# Patient Record
Sex: Male | Born: 1970
Health system: Southern US, Community
[De-identification: ages and names within clinical notes are randomized; demographics above are authoritative.]

## PROBLEM LIST (undated history)

## (undated) DIAGNOSIS — E785 Hyperlipidemia, unspecified: Secondary | ICD-10-CM

## (undated) DIAGNOSIS — K219 Gastro-esophageal reflux disease without esophagitis: Secondary | ICD-10-CM

## (undated) DIAGNOSIS — J45909 Unspecified asthma, uncomplicated: Secondary | ICD-10-CM

## (undated) DIAGNOSIS — I10 Essential (primary) hypertension: Secondary | ICD-10-CM

## (undated) DIAGNOSIS — T7840XA Allergy, unspecified, initial encounter: Secondary | ICD-10-CM

## (undated) HISTORY — PX: OTHER SURGICAL HISTORY: SHX169

## (undated) HISTORY — DX: Allergy, unspecified, initial encounter: T78.40XA

## (undated) HISTORY — DX: Hyperlipidemia, unspecified: E78.5

## (undated) HISTORY — DX: Unspecified asthma, uncomplicated: J45.909

## (undated) HISTORY — PX: HERNIA REPAIR: SHX51

## (undated) HISTORY — DX: Essential (primary) hypertension: I10

## (undated) HISTORY — DX: Gastro-esophageal reflux disease without esophagitis: K21.9

---

## 2003-07-07 ENCOUNTER — Emergency Department (HOSPITAL_COMMUNITY): Admission: AD | Admit: 2003-07-07 | Discharge: 2003-07-07 | Payer: Self-pay | Admitting: Family Medicine

## 2003-09-16 ENCOUNTER — Emergency Department (HOSPITAL_COMMUNITY): Admission: EM | Admit: 2003-09-16 | Discharge: 2003-09-16 | Payer: Self-pay | Admitting: Emergency Medicine

## 2005-06-13 ENCOUNTER — Emergency Department (HOSPITAL_COMMUNITY): Admission: EM | Admit: 2005-06-13 | Discharge: 2005-06-13 | Payer: Self-pay | Admitting: Family Medicine

## 2005-08-19 ENCOUNTER — Emergency Department (HOSPITAL_COMMUNITY): Admission: EM | Admit: 2005-08-19 | Discharge: 2005-08-19 | Payer: Self-pay | Admitting: Family Medicine

## 2006-02-14 ENCOUNTER — Emergency Department (HOSPITAL_COMMUNITY): Admission: EM | Admit: 2006-02-14 | Discharge: 2006-02-14 | Payer: Self-pay | Admitting: Emergency Medicine

## 2006-02-26 ENCOUNTER — Emergency Department (HOSPITAL_COMMUNITY): Admission: AD | Admit: 2006-02-26 | Discharge: 2006-02-26 | Payer: Self-pay | Admitting: Family Medicine

## 2006-12-06 ENCOUNTER — Emergency Department (HOSPITAL_COMMUNITY): Admission: EM | Admit: 2006-12-06 | Discharge: 2006-12-06 | Payer: Self-pay | Admitting: Emergency Medicine

## 2007-09-09 ENCOUNTER — Emergency Department (HOSPITAL_COMMUNITY): Admission: EM | Admit: 2007-09-09 | Discharge: 2007-09-09 | Payer: Self-pay | Admitting: Emergency Medicine

## 2008-07-15 ENCOUNTER — Emergency Department (HOSPITAL_COMMUNITY): Admission: EM | Admit: 2008-07-15 | Discharge: 2008-07-15 | Payer: Self-pay | Admitting: Emergency Medicine

## 2008-07-17 ENCOUNTER — Emergency Department (HOSPITAL_COMMUNITY): Admission: EM | Admit: 2008-07-17 | Discharge: 2008-07-17 | Payer: Self-pay | Admitting: Emergency Medicine

## 2008-09-26 ENCOUNTER — Ambulatory Visit: Payer: Self-pay | Admitting: Cardiology

## 2008-09-26 ENCOUNTER — Inpatient Hospital Stay (HOSPITAL_COMMUNITY): Admission: EM | Admit: 2008-09-26 | Discharge: 2008-09-27 | Payer: Self-pay | Admitting: Emergency Medicine

## 2008-09-27 ENCOUNTER — Encounter (INDEPENDENT_AMBULATORY_CARE_PROVIDER_SITE_OTHER): Payer: Self-pay | Admitting: *Deleted

## 2008-10-28 ENCOUNTER — Telehealth (INDEPENDENT_AMBULATORY_CARE_PROVIDER_SITE_OTHER): Payer: Self-pay | Admitting: *Deleted

## 2008-10-28 DIAGNOSIS — I10 Essential (primary) hypertension: Secondary | ICD-10-CM

## 2008-10-29 ENCOUNTER — Encounter: Payer: Self-pay | Admitting: Cardiology

## 2008-10-29 ENCOUNTER — Ambulatory Visit: Payer: Self-pay | Admitting: Cardiology

## 2008-10-29 ENCOUNTER — Encounter: Payer: Self-pay | Admitting: Cardiovascular Disease

## 2008-10-29 ENCOUNTER — Ambulatory Visit: Payer: Self-pay

## 2008-10-29 DIAGNOSIS — R072 Precordial pain: Secondary | ICD-10-CM

## 2008-10-29 LAB — CONVERTED CEMR LAB
AST: 24 units/L (ref 0–37)
Albumin: 4.1 g/dL (ref 3.5–5.2)
Alkaline Phosphatase: 59 units/L (ref 39–117)
Bilirubin, Direct: 0 mg/dL (ref 0.0–0.3)
Direct LDL: 149.5 mg/dL
Glucose, Bld: 104 mg/dL — ABNORMAL HIGH (ref 70–99)
HDL: 43.1 mg/dL (ref >39.00)
Hgb A1c MFr Bld: 5.6 % (ref 4.6–6.5)
Total Bilirubin: 1 mg/dL (ref 0.3–1.2)
Total CHOL/HDL Ratio: 5
VLDL: 23.8 mg/dL (ref 0.0–40.0)

## 2009-11-06 IMAGING — CT CT HEAD W/O CM - CT MAXILLOFACIAL W/O CM
3 series · 16 of 47 positions shown, 19 images · non-contrast
Comparison: NONE

CLINICAL DATA: Sinusitis.  Cough, wheezing. 

CT PARANASAL SINUSES
TECHNIQUE: Axial, sagittal, and coronal images were obtained.

[Series 3: 3x3 · axial · 0.35mm/px · z∈[-441,-345]mm · 10 of 40 slices shown, 13 images]
[im 3/40  brain]
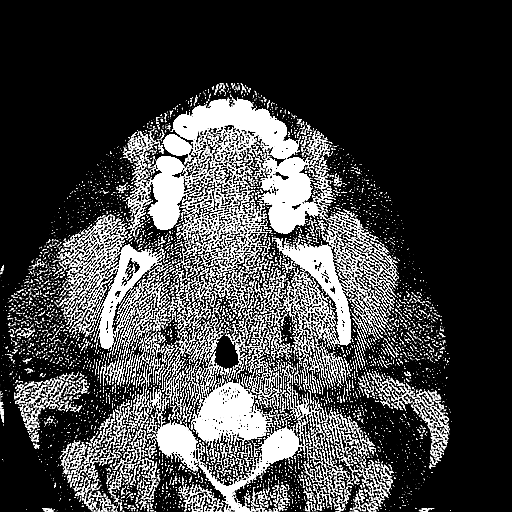
[im 3/40  bone]
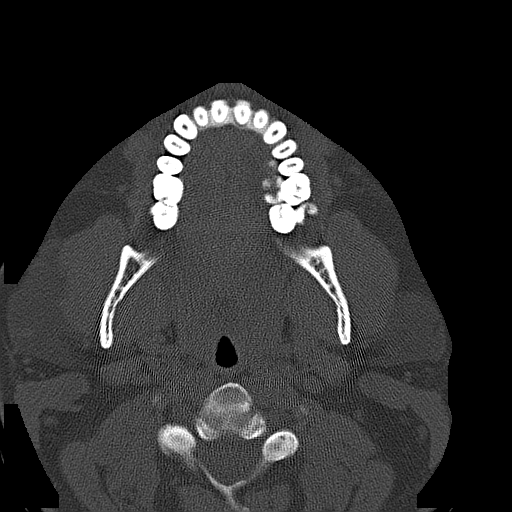
[im 6/40  bone]
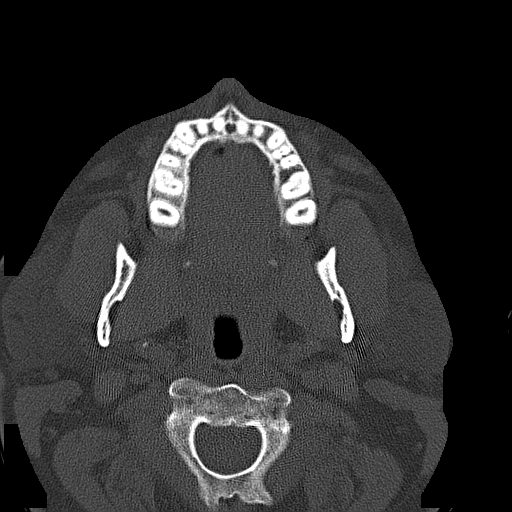
[im 10/40  bone]
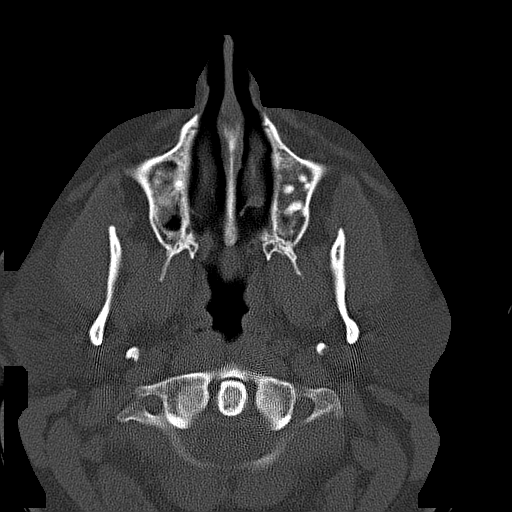
[im 13/40  bone]
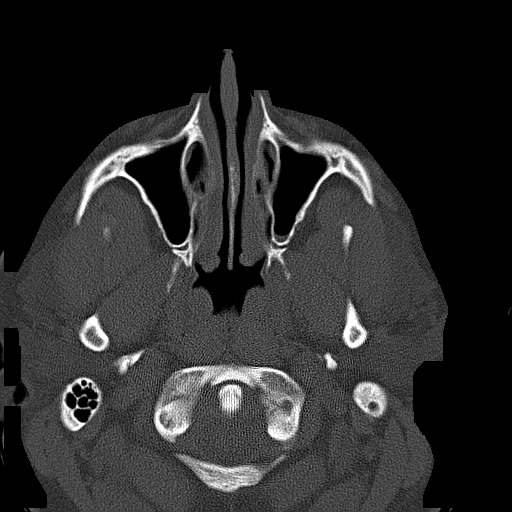
[im 15/40  brain]
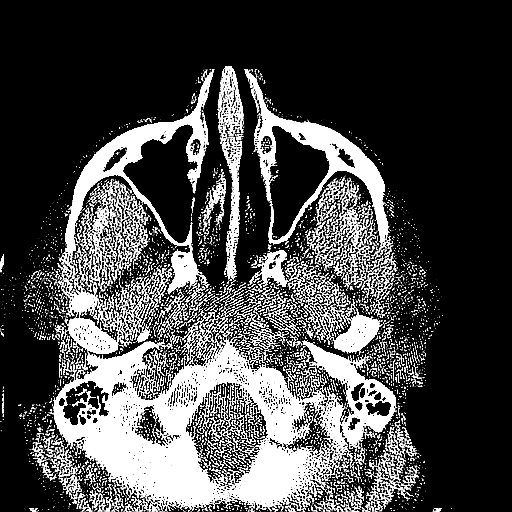
[im 15/40  bone]
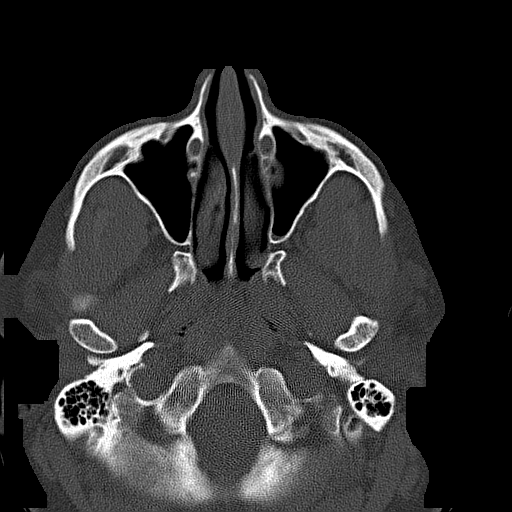
[im 19/40  bone]
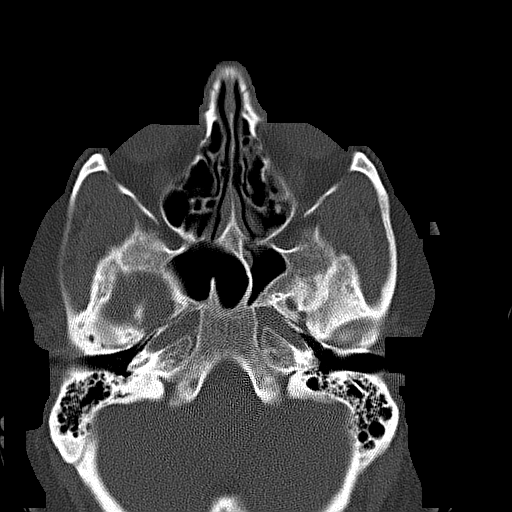
[im 22/40  bone]
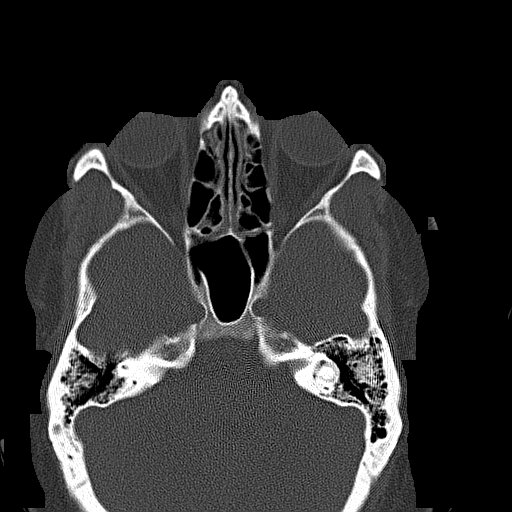
[im 26/40  bone]
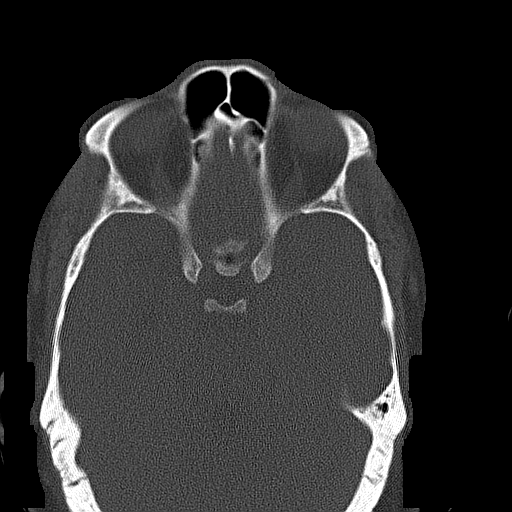
[im 29/40  brain]
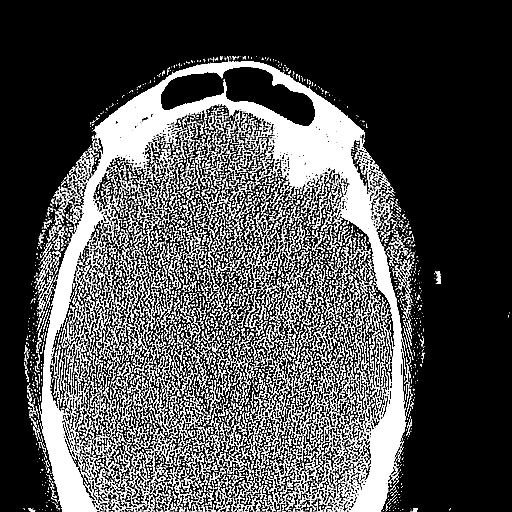
[im 29/40  bone]
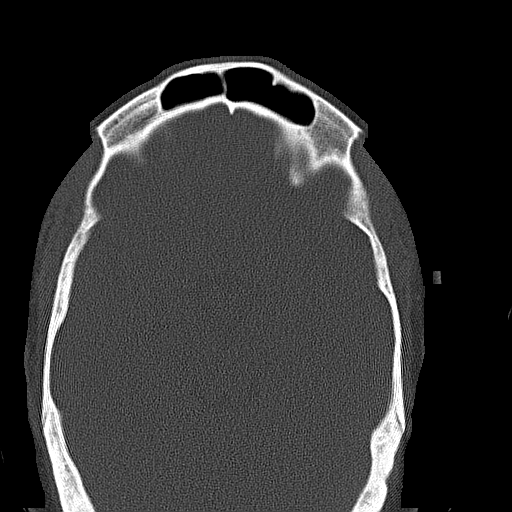
[im 35/40  bone]
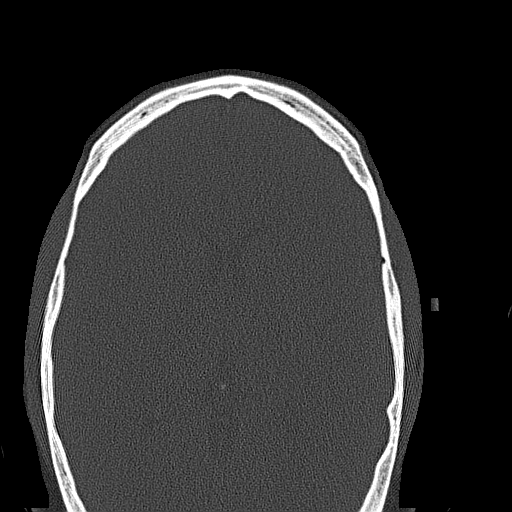

[coronals · coronal · 0.35mm/px · 3 of 28 slices shown]
[im 10/28  bone]
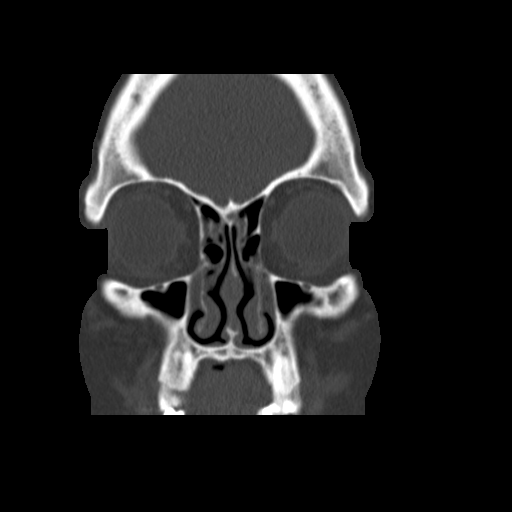
[im 13/28  bone]
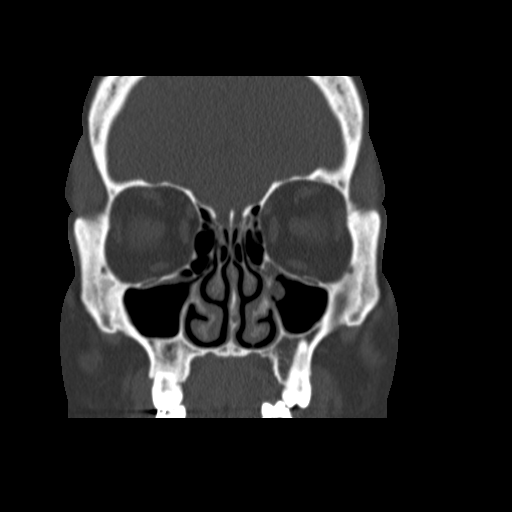
[im 16/28  bone]
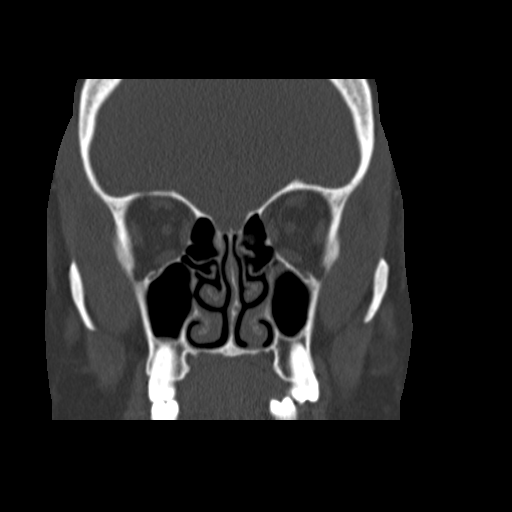

[sagittals · sagittal · 0.35mm/px · 3 of 29 slices shown]
[im 10/29  bone]
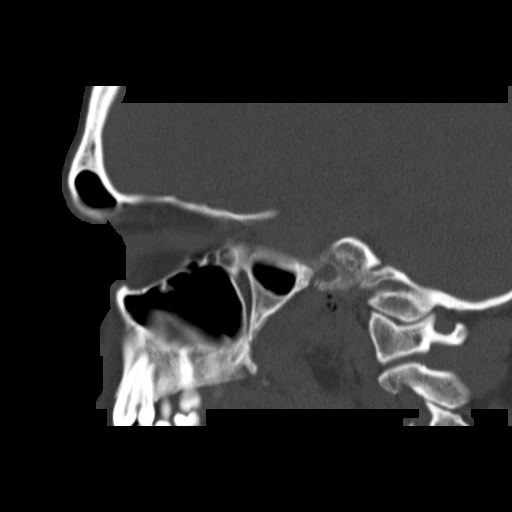
[im 15/29  bone]
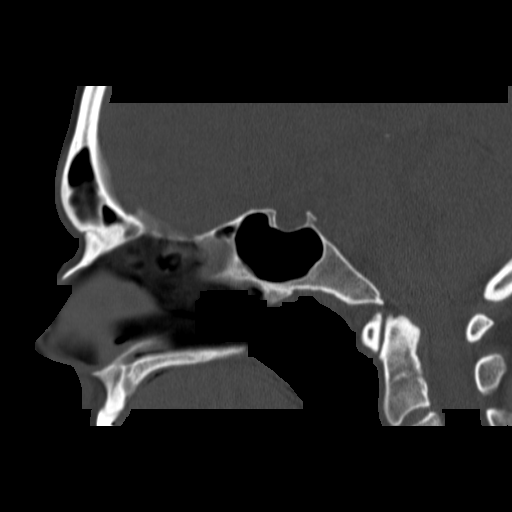
[im 19/29  bone]
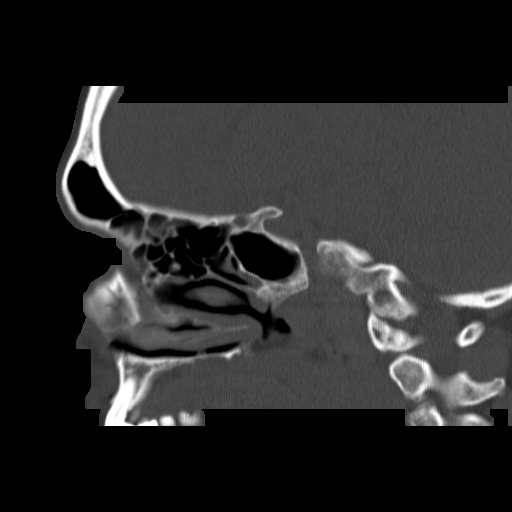

[16 of 47 positions shown; findings below may reference images not displayed]

FINDINGS: There is mucosal occlusion at the infundibulum of the 
OMC on the left.  The infundibulum on the right is patent, but 
narrowed.  No occlusion or polyposis of the nasal passage. Scant 
mucosal thickening is noted in the maxillary sinuses.  Scant 
mucosal thickening in the sphenoid and ethmoid sinuses.  Frontal 
sinus is clear.  Orbits and mastoids are unremarkable.  No 
evidence of nasopharyngeal mass. No fractures, lytic or blastic 
lesions.
IMPRESSION: Minimal-mild areas of chronic mucosal thickening.  No 
evidence of air-fluid level to suggest acute sinusitis. Per Frank Lundblad 
07/17/2008  Tran Date:  07/17/2008 DAS  JLM

## 2009-11-06 IMAGING — CR DG CHEST 2V
1 series · 2 of 2 positions shown · non-contrast
Comparison: NONE

CLINICAL DATA: Severe headaches. Cough. Wheezing. 

CHEST TWO VIEW (PA AND LATERAL)

[Series 1: view not recorded · 0.17mm/px · 2 of 2 slices shown]
[im 1/2]
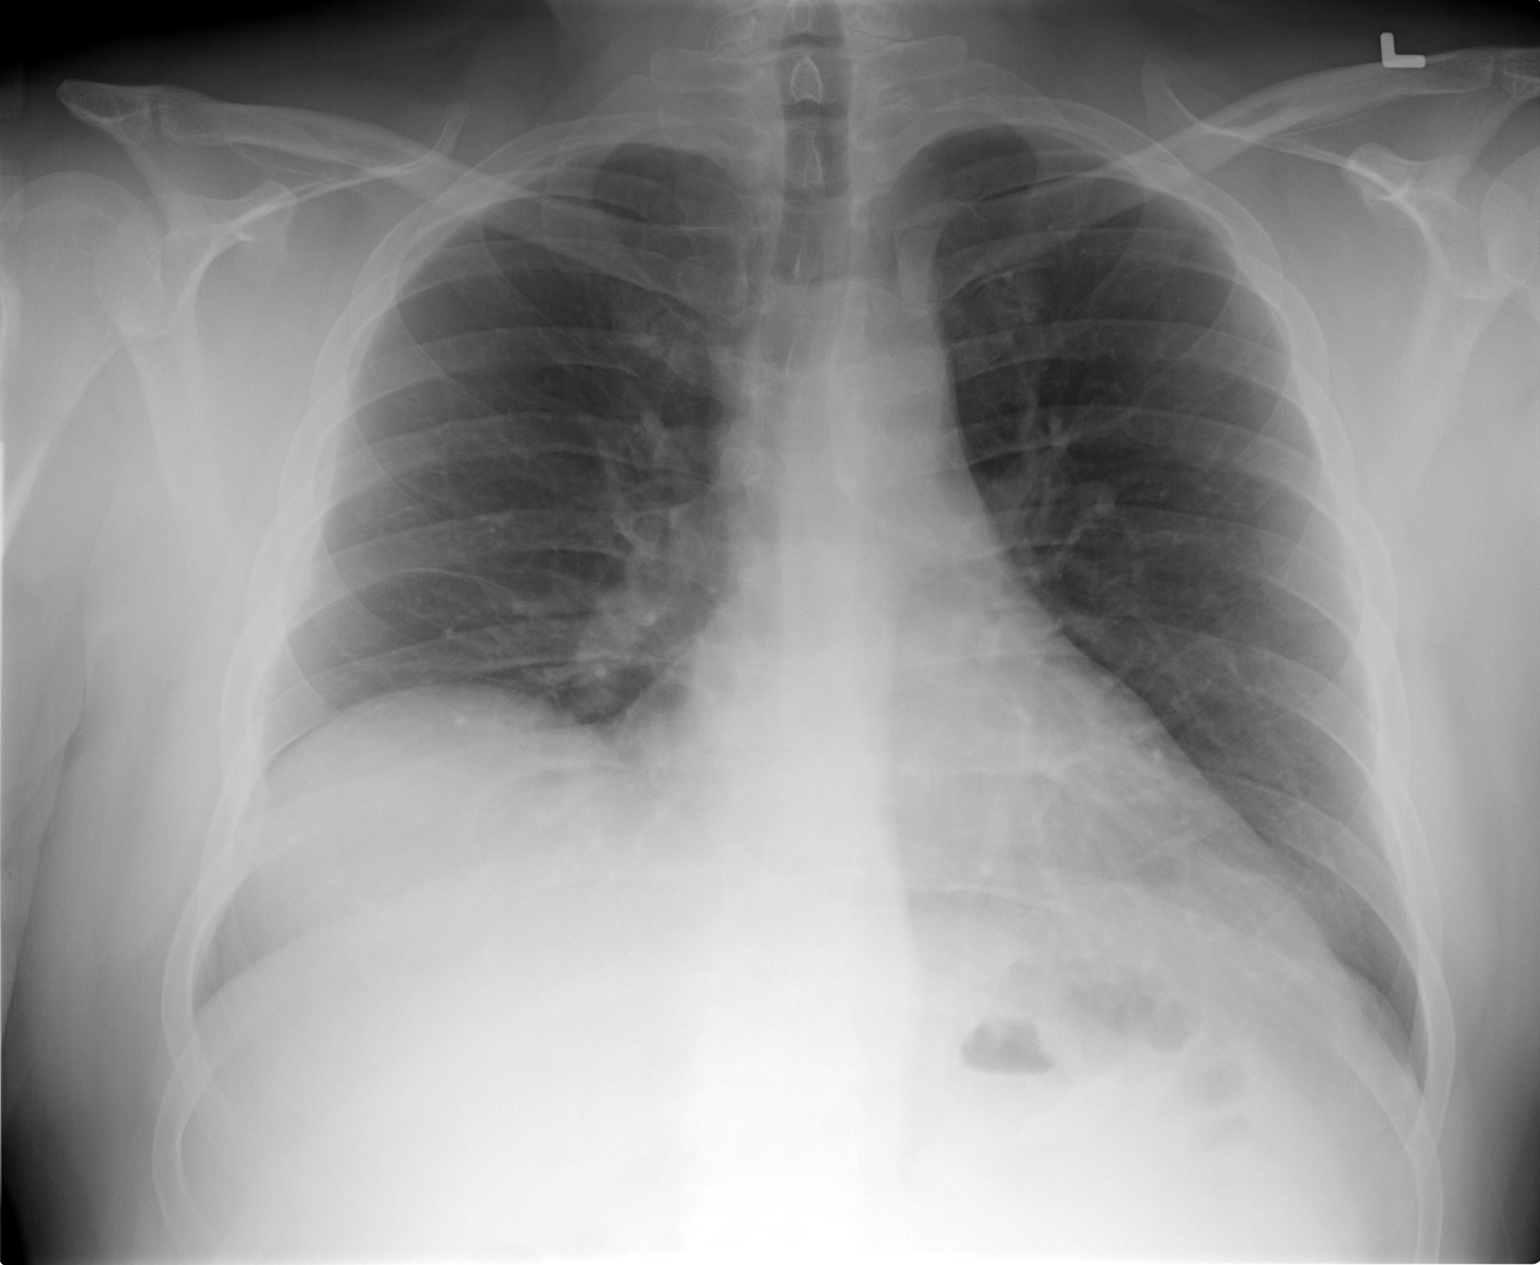
[im 2/2]
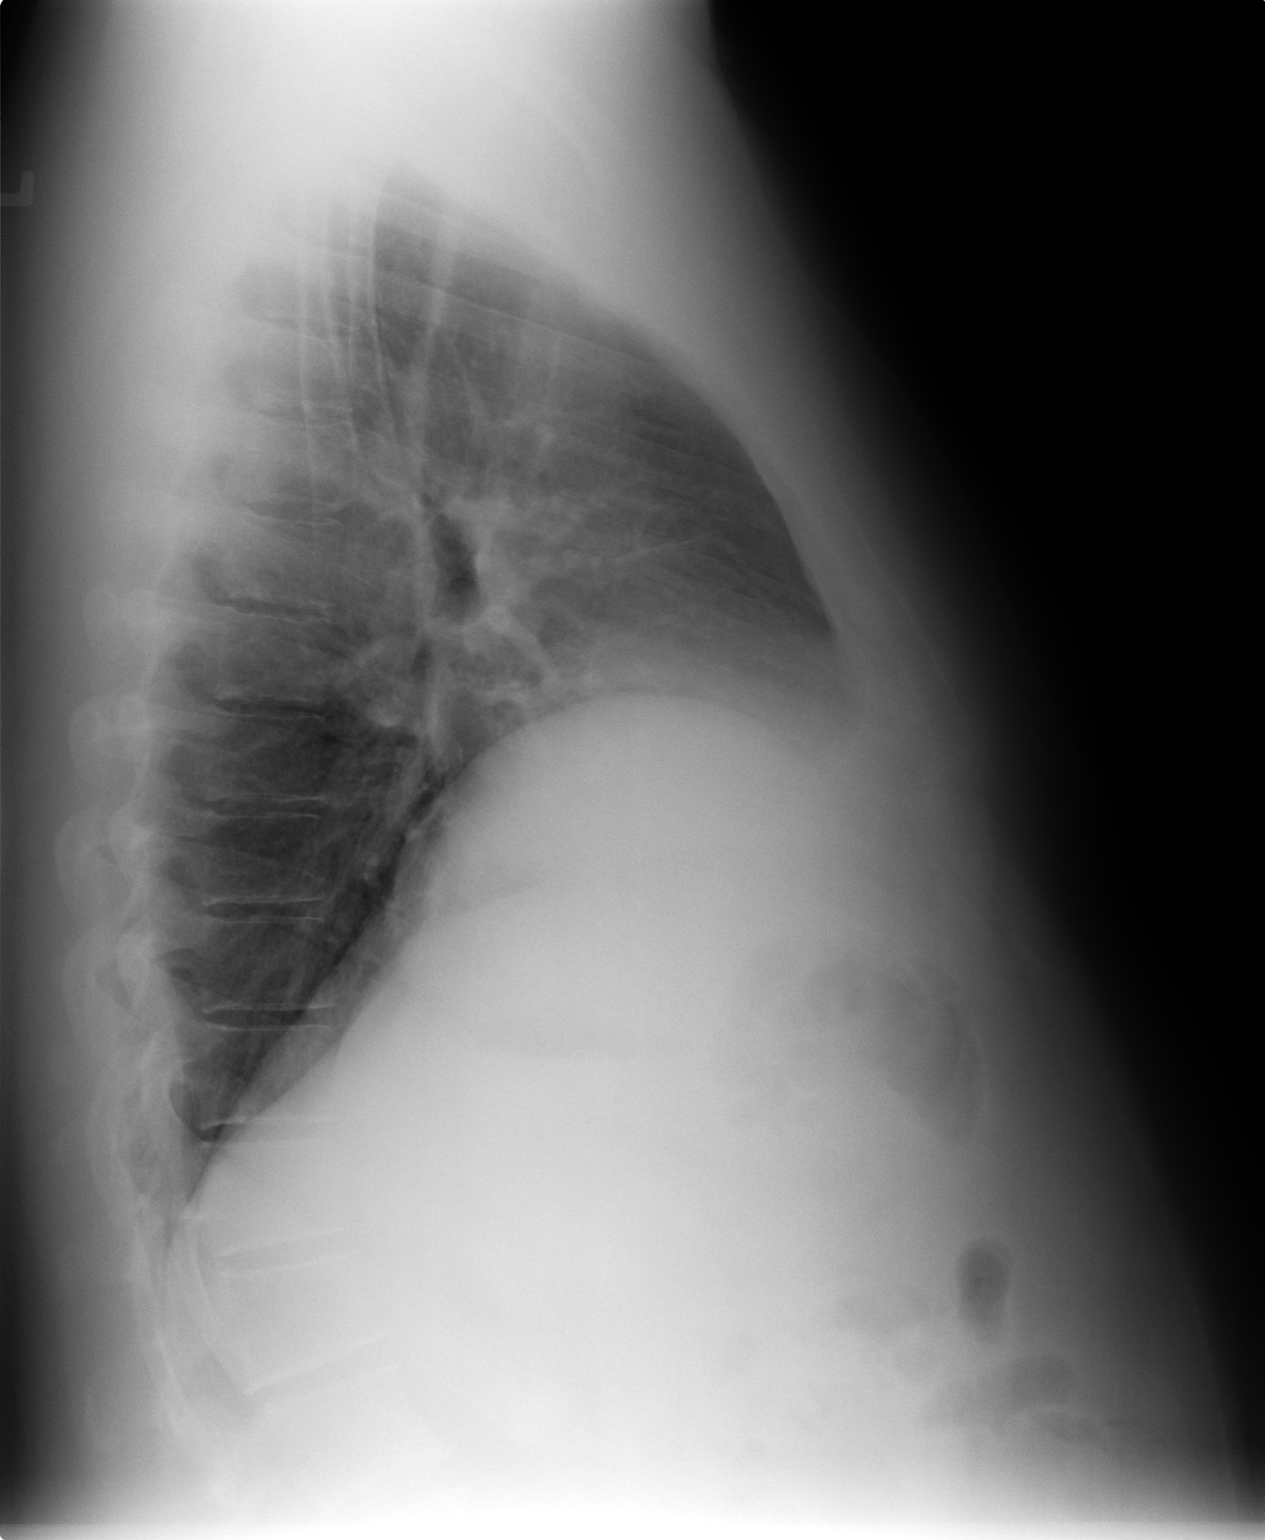

[2 of 2 positions shown; findings below may reference images not displayed]

FINDINGS: Chronic-appearing elevation of the right hemidiaphragm. 
No infiltrate, edema, mass, or effusion. Heart and bony structures 
are unremarkable. 

electronically reviewed on 07/16/2008 Dict Date: 07/15/2008  Tran 
Date:  07/16/2008 DAS  [REDACTED]

## 2010-10-07 ENCOUNTER — Telehealth (INDEPENDENT_AMBULATORY_CARE_PROVIDER_SITE_OTHER): Payer: Self-pay | Admitting: *Deleted

## 2010-10-09 ENCOUNTER — Encounter (HOSPITAL_BASED_OUTPATIENT_CLINIC_OR_DEPARTMENT_OTHER)
Admission: RE | Admit: 2010-10-09 | Discharge: 2010-10-09 | Disposition: A | Discharge status: Home / Self Care | Payer: BC Managed Care – PPO | Source: Ambulatory Visit | Attending: Surgery | Admitting: Surgery

## 2010-10-09 DIAGNOSIS — Z01812 Encounter for preprocedural laboratory examination: Secondary | ICD-10-CM | POA: Insufficient documentation

## 2010-10-09 LAB — COMPREHENSIVE METABOLIC PANEL
ALT: 24 U/L (ref 0–53)
Albumin: 4 g/dL (ref 3.5–5.2)
Alkaline Phosphatase: 61 U/L (ref 39–117)
BUN: 8 mg/dL (ref 6–23)
Calcium: 9.4 mg/dL (ref 8.4–10.5)
Glucose, Bld: 101 mg/dL — ABNORMAL HIGH (ref 70–99)
Potassium: 5 mEq/L (ref 3.5–5.1)
Sodium: 138 mEq/L (ref 135–145)
Total Protein: 7.4 g/dL (ref 6.0–8.3)

## 2010-10-09 LAB — DIFFERENTIAL
Basophils Absolute: 0.1 10*3/uL (ref 0.0–0.1)
Basophils Relative: 1 % (ref 0–1)
Eosinophils Absolute: 0.3 10*3/uL (ref 0.0–0.7)
Eosinophils Relative: 3 % (ref 0–5)
Monocytes Absolute: 1 10*3/uL (ref 0.1–1.0)

## 2010-10-09 LAB — CBC
MCHC: 34.8 g/dL (ref 30.0–36.0)
Platelets: 352 10*3/uL (ref 150–400)
RDW: 12.6 % (ref 11.5–15.5)
WBC: 9 10*3/uL (ref 4.0–10.5)

## 2010-10-13 ENCOUNTER — Ambulatory Visit (HOSPITAL_BASED_OUTPATIENT_CLINIC_OR_DEPARTMENT_OTHER)
Admission: RE | Admit: 2010-10-13 | Discharge: 2010-10-13 | Disposition: A | Discharge status: Home / Self Care | Payer: BC Managed Care – PPO | Source: Ambulatory Visit | Attending: Surgery | Admitting: Surgery

## 2010-10-13 DIAGNOSIS — E669 Obesity, unspecified: Secondary | ICD-10-CM | POA: Insufficient documentation

## 2010-10-13 DIAGNOSIS — K420 Umbilical hernia with obstruction, without gangrene: Secondary | ICD-10-CM | POA: Insufficient documentation

## 2010-10-13 DIAGNOSIS — Z01818 Encounter for other preprocedural examination: Secondary | ICD-10-CM | POA: Insufficient documentation

## 2010-10-13 DIAGNOSIS — I10 Essential (primary) hypertension: Secondary | ICD-10-CM | POA: Insufficient documentation

## 2010-10-15 NOTE — Progress Notes (Signed)
Summary: Records Request  Faxed Stress to [facility] ([phone]). Debby Freiberg  October 07, 2010 4:13 PM

## 2010-10-23 NOTE — Op Note (Signed)
  NAME:  Terry Gilmore, Terry Gilmore NO.:  [id number]  MEDICAL RECORD NO.:  [id number]          PATIENT TYPE:  LOCATION:                                 FACILITY:  PHYSICIAN:  Clovis Pu. Haydn Hutsell, M.D.DATE OF BIRTH:  1970-12-05  DATE OF PROCEDURE:  10/13/2010 DATE OF DISCHARGE:                              OPERATIVE REPORT   PREOPERATIVE DIAGNOSIS:  Incarcerated umbilical hernia.  POSTOPERATIVE DIAGNOSIS:  Incarcerated umbilical hernia with preperitoneal fat measuring 1 cm.  PROCEDURE:  Primary repair of umbilical hernia.  SURGEON:  Maisie Fus A. Sharion Grieves, MD  ANESTHESIA:  General endotracheal anesthesia 0.2 with block administered by Anesthesia.  ESTIMATED BLOOD LOSS:  Minimal.  SPECIMENS:  None.  INDICATIONS FOR PROCEDURE:  The patient is a 40 year old male who had a painful bulge just below his umbilicus.  On examination, it was felt to be a small incarcerated umbilical hernia with preperitoneal fat.  It was painful and symptomatic and wished to have repaired based on its appearance.  Informed consent was obtained.  Risk of bleeding, infection, organ injury, recurrence, potential small-bowel obstruction were all discussed.  He understood the above and agreed to proceed.  DESCRIPTION OF PROCEDURE:  The patient was brought to the operating room after undergoing a block in the holding area by Anesthesia in the periumbilical region.  After induction of general endotracheal anesthesia, time-out was done.  Periumbilical region was prepped and draped in sterile fashion.  Infraumbilical incision was made measuring about 2 cm.  The hernia sac was identified and opened.  There was a small piece of preperitoneal fat that I reduced back into the fascia. The fascial defect was roughly a centimeter in maximal diameter.  Given the small size of this, I elected to close it primarily with #1 Novafil sutures.  Three stitches were used to close the defect with a centimeter sac.  The  subcu tissue was irrigated.  I secured the fascia down to the umbilicus with 3-0 Vicryl.  4-0 Monocryl was used to close the skin.  Dermabond was applied to the wound.  All final counts of sponge, needle, and instruments were found to be correct.  The patient was extubated, taken to recovery in satisfactory condition.     Lucienne Sawyers A. Lorah Kalina, M.D.     TAC/MEDQ  D:  10/13/2010  T:  10/14/2010  Job:  [id number]  cc:   Georgianne Fick, M.D.  Electronically Signed by Harriette Bouillon M.D. on 10/23/2010 09:19:34 AM

## 2010-11-24 LAB — BASIC METABOLIC PANEL
Calcium: 9 mg/dL (ref 8.4–10.5)
Creatinine, Ser: 0.75 mg/dL (ref 0.4–1.5)
GFR calc non Af Amer: >60 mL/min (ref >60)
Glucose, Bld: 103 mg/dL — ABNORMAL HIGH (ref 70–99)
Sodium: 137 mEq/L (ref 135–145)

## 2010-11-24 LAB — CARDIAC PANEL(CRET KIN+CKTOT+MB+TROPI)
CK, MB: 1.1 ng/mL (ref 0.3–4.0)
Relative Index: INVALID (ref 0.0–2.5)
Relative Index: INVALID (ref 0.0–2.5)
Total CK: 58 U/L (ref 7–232)
Total CK: 61 U/L (ref 7–232)
Troponin I: <0.01 ng/mL (ref 0.00–0.06)
Troponin I: <0.01 ng/mL (ref 0.00–0.06)

## 2010-11-24 LAB — CK TOTAL AND CKMB (NOT AT ARMC)
Relative Index: INVALID (ref 0.0–2.5)
Total CK: 57 U/L (ref 7–232)

## 2010-11-24 LAB — CBC
HCT: 51.4 % (ref 39.0–52.0)
Hemoglobin: 16.6 g/dL (ref 13.0–17.0)
MCHC: 34.7 g/dL (ref 30.0–36.0)
MCHC: 35.7 g/dL (ref 30.0–36.0)
MCV: 89.3 fL (ref 78.0–100.0)
Platelets: 261 10*3/uL (ref 150–400)
Platelets: 300 10*3/uL (ref 150–400)
RBC: 5.76 MIL/uL (ref 4.22–5.81)
RDW: 12.7 % (ref 11.5–15.5)
RDW: 13.3 % (ref 11.5–15.5)
WBC: 14.6 10*3/uL — ABNORMAL HIGH (ref 4.0–10.5)

## 2010-11-24 LAB — DIFFERENTIAL
Basophils Absolute: 0 10*3/uL (ref 0.0–0.1)
Basophils Relative: 0 % (ref 0–1)
Basophils Relative: 1 % (ref 0–1)
Eosinophils Absolute: 0.7 10*3/uL (ref 0.0–0.7)
Eosinophils Relative: 5 % (ref 0–5)
Lymphocytes Relative: 29 % (ref 12–46)
Monocytes Absolute: 0.9 10*3/uL (ref 0.1–1.0)
Monocytes Absolute: 1.2 10*3/uL — ABNORMAL HIGH (ref 0.1–1.0)
Monocytes Relative: 8 % (ref 3–12)
Neutro Abs: 4.9 10*3/uL (ref 1.7–7.7)
Neutrophils Relative %: 55 % (ref 43–77)

## 2010-11-24 LAB — POCT CARDIAC MARKERS
CKMB, poc: <1 ng/mL — ABNORMAL LOW (ref 1.0–8.0)
Myoglobin, poc: 51.4 ng/mL (ref 12–200)
Troponin i, poc: <0.05 ng/mL (ref 0.00–0.09)

## 2010-11-24 LAB — COMPREHENSIVE METABOLIC PANEL
ALT: 48 U/L (ref 0–53)
AST: 35 U/L (ref 0–37)
Albumin: 4.6 g/dL (ref 3.5–5.2)
Alkaline Phosphatase: 60 U/L (ref 39–117)
Glucose, Bld: 124 mg/dL — ABNORMAL HIGH (ref 70–99)
Potassium: 3.4 mEq/L — ABNORMAL LOW (ref 3.5–5.1)
Sodium: 136 mEq/L (ref 135–145)
Total Protein: 7.6 g/dL (ref 6.0–8.3)

## 2010-11-24 LAB — CULTURE, BLOOD (ROUTINE X 2)

## 2010-12-22 NOTE — Discharge Summary (Signed)
NAME:  Terry Gilmore, Terry Gilmore             ACCOUNT NO.:  [id number]   MEDICAL RECORD NO.:  [id number]          PATIENT TYPE:  INP   LOCATION:  1407                         FACILITY:  [facility]   PHYSICIAN:  Charlestine Massed, MDDATE OF BIRTH:  04-02-1971   DATE OF ADMISSION:  09/26/2008  DATE OF DISCHARGE:  09/27/2008                               DISCHARGE SUMMARY   PRIMARY CARE PHYSICIAN:  Georgianne Fick, M.D.   CARDIOLOGY CONSULT:  Everardo Beals. Juanda Chance, MD, [facility].   REASON FOR ADMISSION:  Chest pain.   HOSPITAL COURSE:  Mr. Terry Gilmore is a 40 year old white male who  has a past history significant for hypertension and unspecified  tachycardia.  Had pain in the epigastrium which was different from the  usual pain.  It was sustained for some time which happened while he was  dropping his kids at school.  When it occurred again for the second  time, he came to the hospital.  In the hospital, he had some relief with  belching.  He denies any increase in pain with walking.  His pain was on  a scale of 5 out of 10.   The patient was admitted to the hospital.  EKG showed no abnormalities.  Cardiac enzymes, 3 sets, were negative.  The patient was evaluated for a  stress test and cardiaology exam.  The patient was seen by cardiology,  , Dr. Juanda Chance.  The patient was evaluated and found that he was  fit to go home and have stress test as an outpatient.  No evidence of  chest pains, blood pressure is stable, so the patient is discharged.   DISCHARGE DIAGNOSES:  1. Atypical chest pain, myocardial infarction ruled out, ruled out      ischemic heart disease.  Stress test as an outpatient.  2. Hypertension.  3. Unspecified tachycardia.  4. Mild anxiety.   DISCHARGE MEDICATIONS:  Continue the medications which he was on,  namely:  1. Cardizem CD 10 mg p.o. daily.  2. Lisinopril 20 mg p.o. daily.  3. Allegra 5 mg daily.   NEW MEDICATIONS:  1. Aspirin 81 mg p.o.  daily, over-the-counter.  2. Doxycycline 100 mg p.o. b.i.d. for 6 days.  3. Prilosec 20 mg p.o. daily.   The patient has been educated about not to take any pain killers except  for Tylenol in moderate doses.   DISCHARGE INSTRUCTIONS:  1. No pain killer intake.  2. Proper adherence to medications and proper followup.   FOLLOW UP:  1. To follow up with Dr. Nicholos Johns on Monday or Tuesday.  2. To call Dr. Juanda Chance at [facility] on Monday to set up outpatient stress      test as he has been told by Dr. Juanda Chance here.   The patient is discharged home today.  The patient has been educated  that if chest pain recurs and comes again then to come to the emergency  room as soon as possible.   A total of 45 minutes was spent on this discharge.      Charlestine Massed, MD  Electronically Signed     UT/MEDQ  D:  09/27/2008  T:  09/27/2008  Job:  [id number]   cc:   Georgianne Fick, M.D.  Fax: [phone]   Everardo Beals. Juanda Chance, MD, [facility]  [address]  [place], [place] [address]

## 2010-12-22 NOTE — H&P (Signed)
NAME:  Terry Gilmore, CORNING             ACCOUNT NO.:  [id number]   MEDICAL RECORD NO.:  [id number]          PATIENT TYPE:  INP   LOCATION:  1407                         FACILITY:  [facility]   PHYSICIAN:  Everardo Beals. Juanda Chance, MD, FACCDATE OF BIRTH:  [birth year]   DATE OF ADMISSION:  09/26/2008  DATE OF DISCHARGE:  09/27/2008                              HISTORY & PHYSICAL   PRIMARY CARE PHYSICIAN:  Georgianne Fick, MD.   REASON FOR CONSULTATION:  Evaluation of chest pain.   CLINICAL HISTORY:  Terry Gilmore is 40 year old and was admitted  yesterday with epigastric pain.  He was dropping his kids off at school  when he developed a rather sudden onset of epigastric pain and bloating.  This felt like gas, but it was little bit different because it  persisted.  He belched after about 30 minutes and got a little bit of  relief and then 45-60 minutes he belched again and got a little bit more  relief.  Because the symptoms were little bit different than usual, he  was concerned and he came to the emergency room for evaluation.   He has a history of tachycardia, but no definite arrhythmia.  He has  been treated with Cardizem for both blood pressure and tachycardia.   PAST MEDICAL HISTORY:  Significant for hypertension and GERD.  He also  has a history of irritable bowel syndrome for which he saw Dr.  Jarold Motto.   His medicines include Cardizem CD 300 mg daily, lisinopril 20 mg daily,  and Allegra 5 mg daily.   SOCIAL HISTORY:  He lives in [place].  He is married.  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  He does not smoke.  He has two  children.   FAMILY HISTORY:  Positive for heart disease and his mother had MI at 30,  but I believe she was a smoker.  There is no other family history of  vascular disease.   REVIEW OF SYSTEMS:  Positive for belching and burping.  He has not had  any GU or constitutional symptoms.   PHYSICAL EXAMINATION:  VITAL SIGNS:  Blood pressure was 126/75  and pulse  88 and regular.  NECK:  There was no venous tension.  The carotid pulses were full  without bruits.  CHEST:  Clear.  HEART:  Rhythm was regular.  No murmurs or gallops.  ABDOMEN:  Soft with normal bowel sounds.  There is no  hepatosplenomegaly.  EXTREMITIES:  Peripheral pulses were full.  There is no peripheral  edema.  MUSCULOSKELETAL:  No deformities.  SKIN:  Warm and dry.  NEUROLOGIC:  No focal neurological signs.   ECG showed sinus tachycardia, was otherwise normal.  Troponins were  negative x3.  BNP was less than 30.   IMPRESSION:  1. Chest and epigastric pain somewhat atypical for ischemia, more      likely related to reflux symptoms.  2. History of tachycardia managed with Cardizem without known      documentation of cardiac arrhythmia.  3. Hypertension.   RECOMMENDATIONS:  We had performed an echocardiogram, which was normal.  His ECG and enzymes are normal.  His symptoms are somewhat atypical and  I think he can be discharged today and we will arrange for an outpatient  Myoview scan.  We will call him on Monday to give him the time for that.  I would recommend to discharge him on aspirin until we do the stress  test.      Bruce R. Juanda Chance, MD, [facility]  Electronically Signed     BRB/MEDQ  D:  09/27/2008  T:  09/28/2008  Job:  [id number]   cc:   Georgianne Fick, M.D.

## 2011-02-09 ENCOUNTER — Encounter: Payer: Self-pay | Admitting: Cardiology

## 2013-06-09 ENCOUNTER — Ambulatory Visit (INDEPENDENT_AMBULATORY_CARE_PROVIDER_SITE_OTHER): Payer: BC Managed Care – PPO | Admitting: Family Medicine

## 2013-06-09 DIAGNOSIS — Z23 Encounter for immunization: Secondary | ICD-10-CM

## 2013-07-26 ENCOUNTER — Ambulatory Visit: Payer: BC Managed Care – PPO | Admitting: Family Medicine

## 2013-07-26 VITALS — BP 128/80 | HR 82 | Temp 98.0°F | Resp 16 | Ht 67.0 in | Wt 264.4 lb

## 2013-07-26 DIAGNOSIS — R05 Cough: Secondary | ICD-10-CM

## 2013-07-26 DIAGNOSIS — J45901 Unspecified asthma with (acute) exacerbation: Secondary | ICD-10-CM

## 2013-07-26 DIAGNOSIS — R0982 Postnasal drip: Secondary | ICD-10-CM

## 2013-07-26 DIAGNOSIS — K219 Gastro-esophageal reflux disease without esophagitis: Secondary | ICD-10-CM

## 2013-07-26 LAB — CBC WITH DIFFERENTIAL/PLATELET
Basophils Absolute: 0 10*3/uL (ref 0.0–0.1)
Basophils Relative: 0 % (ref 0–1)
HCT: 49.4 % (ref 39.0–52.0)
Hemoglobin: 17.1 g/dL — ABNORMAL HIGH (ref 13.0–17.0)
Lymphocytes Relative: 17 % (ref 12–46)
Monocytes Absolute: 1.2 10*3/uL — ABNORMAL HIGH (ref 0.1–1.0)
Monocytes Relative: 8 % (ref 3–12)
Neutro Abs: 10.5 10*3/uL — ABNORMAL HIGH (ref 1.7–7.7)
Neutrophils Relative %: 74 % (ref 43–77)
WBC: 14.3 10*3/uL — ABNORMAL HIGH (ref 4.0–10.5)

## 2013-07-26 MED ORDER — OMEPRAZOLE 40 MG PO CPDR: 40.0000 mg | DELAYED_RELEASE_CAPSULE | Freq: Every day | ORAL | Status: DC

## 2013-07-26 MED ORDER — FLUTICASONE PROPIONATE 50 MCG/ACT NA SUSP: 2.0000 | Freq: Every day | NASAL | Status: DC

## 2013-07-26 MED ORDER — METHYLPREDNISOLONE ACETATE 80 MG/ML IJ SUSP
120.0000 mg | Freq: Once | INTRAMUSCULAR | Status: AC
  Administered 2013-07-26: 120 mg via INTRAMUSCULAR

## 2013-07-26 MED ORDER — AZITHROMYCIN 250 MG PO TABS: ORAL_TABLET | ORAL | Status: DC

## 2013-07-26 MED ORDER — IPRATROPIUM BROMIDE 0.03 % NA SOLN: 2.0000 | Freq: Four times a day (QID) | NASAL | Status: DC

## 2013-07-26 MED ORDER — HYDROCODONE-HOMATROPINE 5-1.5 MG/5ML PO SYRP: 5.0000 mL | ORAL_SOLUTION | Freq: Three times a day (TID) | ORAL | Status: DC | PRN

## 2013-07-26 NOTE — Patient Instructions (Signed)
Asthma, Acute Bronchospasm °Acute bronchospasm caused by asthma is also referred to as an asthma attack. Bronchospasm means your air passages become narrowed. The narrowing is caused by inflammation and tightening of the muscles in the air tubes (bronchi) in your lungs. This can make it hard to breath or cause you to wheeze and cough. °CAUSES °Possible triggers are: °· Animal dander from the skin, hair, or feathers of animals. °· Dust mites contained in house dust. °· Cockroaches. °· Pollen from trees or grass. °· Mold. °· Cigarette or tobacco smoke. °· Air pollutants such as dust, household cleaners, hair sprays, aerosol sprays, paint fumes, strong chemicals, or strong odors. °· Cold air or weather changes. Cold air may trigger inflammation. Winds increase molds and pollens in the air. °· Strong emotions such as crying or laughing hard. °· Stress. °· Certain medicines such as aspirin or beta-blockers. °· Sulfites in foods and drinks, such as dried fruits and wine. °· Infections or inflammatory conditions, such as a flu, cold, or inflammation of the nasal membranes (rhinitis). °· Gastroesophageal reflux disease (GERD). GERD is a condition where stomach acid backs up into your throat (esophagus). °· Exercise or strenuous activity. °SIGNS AND SYMPTOMS  °· Wheezing. °· Excessive coughing, particularly at night. °· Chest tightness. °· Shortness of breath. °DIAGNOSIS  °Your health care provider will ask you about your medical history and perform a physical exam. A chest X-ray or blood testing may be performed to look for other causes of your symptoms or other conditions that may have triggered your asthma attack.  °TREATMENT  °Treatment is aimed at reducing inflammation and opening up the airways in your lungs.  Most asthma attacks are treated with inhaled medicines. These include quick relief or rescue medicines (such as bronchodilators) and controller medicines (such as inhaled corticosteroids). These medicines are  sometimes given through an inhaler or a nebulizer. Systemic steroid medicine taken by mouth or given through an IV tube also can be used to reduce the inflammation when an attack is moderate or severe. Antibiotic medicines are only used if a bacterial infection is present.  °HOME CARE INSTRUCTIONS  °· Rest. °· Drink plenty of liquids. This helps the mucus to remain thin and be easily coughed up. Only use caffeine in moderation and do not use alcohol until you have recovered from your illness. °· Do not smoke. Avoid being exposed to secondhand smoke. °· You play a critical role in keeping yourself in good health. Avoid exposure to things that cause you to wheeze or to have breathing problems. °· Keep your medicines up to date and available. Carefully follow your health care provider's treatment plan. °· Take your medicine exactly as prescribed. °· When pollen or pollution is bad, keep windows closed and use an air conditioner or go to places with air conditioning. °· Asthma requires careful medical care. See your health care provider for a follow-up as advised. If you are more than [redacted] weeks pregnant and you were prescribed any new medicines, let your obstetrician know about the visit and how you are doing. Follow-up with your health care provider as directed. °· After you have recovered from your asthma attack, make an appointment with your outpatient doctor to talk about ways to reduce the likelihood of future attacks. If you do not have a doctor who manages your asthma, make an appointment with a primary care doctor to discuss your asthma. °SEEK IMMEDIATE MEDICAL CARE IF:  °· You are getting worse. °· You have trouble breathing. If severe, call   your local emergency services (911 in the [place] °· You develop chest pain or discomfort. °· You are vomiting. °· You are not able to keep fluids down. °· You are coughing up yellow, green, brown, or bloody sputum. °· You have a fever and your symptoms suddenly get  worse. °· You have trouble swallowing. °MAKE SURE YOU:  °· Understand these instructions. °· Will watch your condition. °· Will get help right away if you are not doing well or get worse. °Document Released: 11/10/2006 Document Revised: 03/28/2013 Document Reviewed: 01/31/2013 °ExitCare® Patient Information ©2014 [employer]. ° °

## 2013-07-26 NOTE — Progress Notes (Addendum)
Subjective:    Patient ID: Terry Gilmore, male    DOB: 17-Oct-1970, 42 y.o.   MRN: [id number] This chart was scribed for Norberto Sorenson, MD by Danella Maiers, ED Scribe. This patient was seen in room 8 and the patient's care was started at 2:04 PM.  Chief Complaint  Patient presents with  . Cough    X 10-12 days, had previous rx of prednisone and took it for last 6 days  . Gastrophageal Reflux    X 2 days    HPI HPI Comments: Terry Gilmore is a 42 y.o. male with a h/o asthma and GERD who presents to the [facility] complaining of unchanged dry cough described as a tickle in the throat for the last 2 weeks with mild voice change. He states occasionally the cough is productive with yellow sputum. Pt reports cough is worse during the day and when talking or laughing a lot. Pt reports laughing has always been a trigger for his asthma. Pt reports he had a previous prescription for prednisone 10mg  and has been taking it for the last 6 days with no relief, although it is "definitely not worse". His last asthma exacerbation was years ago. He has an inhaler at home which he does not use normally but started using 3-4 times per day for the last 2 weeks with mild relief. He has also been using Delsym with moderate relief. Pt denies fever, SOB, wheezing.  Pt is also complaining reflux for the last 4-5 days that worsened in the last 2 days. He had an episode of regurgitation last night. He has been taking Zantac.  PCP Georgianne Fick, MD  Past Medical History  Diagnosis Date  . Hypertension   . GERD (gastroesophageal reflux disease)   . Allergy   . Asthma    Current Outpatient Prescriptions on File Prior to Visit  Medication Sig Dispense Refill  . Fexofenadine HCl (ALLEGRA PO) Take by mouth daily.        Marland Kitchen lisinopril-hydrochlorothiazide (PRINZIDE,ZESTORETIC) 20-12.5 MG per tablet Take 1 tablet by mouth daily.        Marland Kitchen aspirin 81 MG tablet Take 81 mg by mouth daily.        Marland Kitchen  diltiazem (CARDIZEM CD) 120 MG 24 hr capsule Take 120 mg by mouth daily.        Marland Kitchen omeprazole (PRILOSEC) 20 MG capsule Take 20 mg by mouth daily.         No current facility-administered medications on file prior to visit.   Allergies  Allergen Reactions  . Codeine     REACTION: Nausea/Vomiting     Review of Systems  Constitutional: Negative for fever, chills, diaphoresis, activity change and appetite change.  HENT: Positive for voice change. Negative for congestion and rhinorrhea.   Respiratory: Positive for cough. Negative for chest tightness, shortness of breath and wheezing.   Cardiovascular: Negative for chest pain and palpitations.  Gastrointestinal: Positive for vomiting and abdominal pain.  Hematological: Negative for adenopathy.  Psychiatric/Behavioral: Negative for sleep disturbance.      BP 128/80  Pulse 82  Temp(Src) 98 F (36.7 C) (Oral)  Resp 16  Ht 5\' 7"  (1.702 m)  Wt 264 lb 6.4 oz (119.931 kg)  BMI 41.40 kg/m2  SpO2 97%  Objective:   Physical Exam  Nursing note and vitals reviewed. Constitutional: He is oriented to person, place, and time. He appears well-developed and well-nourished. No distress.  HENT:  Head: Normocephalic and atraumatic.  White  streaking in posterior oropharynx from post nasal drip.   Eyes: EOM are normal.  Neck: Neck supple. No tracheal deviation present.  Cardiovascular: Normal rate, regular rhythm, S1 normal, S2 normal and normal heart sounds.   No murmur heard. Pulmonary/Chest: Effort normal. No respiratory distress.  Decreased breath sounds throughout  Musculoskeletal: Normal range of motion.  Neurological: He is alert and oriented to person, place, and time.  Skin: Skin is warm and dry.  Psychiatric: He has a normal mood and affect. His behavior is normal.   Peak flow measured 500, predicted 620.    Results for orders placed in visit on 07/26/13  CBC WITH DIFFERENTIAL      Result Value Range   WBC 14.3 (*) 4.0 - 10.5 K/uL     RBC 5.69  4.22 - 5.81 MIL/uL   Hemoglobin 17.1 (*) 13.0 - 17.0 g/dL   HCT 40.9  81.1 - 91.4 %   MCV 86.8  78.0 - 100.0 fL   MCH 30.1  26.0 - 34.0 pg   MCHC 34.6  30.0 - 36.0 g/dL   RDW 78.2  95.6 - 21.3 %   Platelets 335  150 - 400 K/uL   Neutrophils Relative % 74  43 - 77 %   Neutro Abs 10.5 (*) 1.7 - 7.7 K/uL   Lymphocytes Relative 17  12 - 46 %   Lymphs Abs 2.5  0.7 - 4.0 K/uL   Monocytes Relative 8  3 - 12 %   Monocytes Absolute 1.2 (*) 0.1 - 1.0 K/uL   Eosinophils Relative 1  0 - 5 %   Eosinophils Absolute 0.1  0.0 - 0.7 K/uL   Basophils Relative 0  0 - 1 %   Basophils Absolute 0.0  0.0 - 0.1 K/uL   Smear Review Criteria for review not met     Assessment & Plan:  Cough - Plan: CBC with Differential  Asthma with acute exacerbation - Plan: methylPREDNISolone acetate (DEPO-MEDROL) injection 120 mg - start zpack w/ prn hycodan  GERD (gastroesophageal reflux disease)- start omeprazole, likely exacerbated by recent oral steroids  Postnasal drip  Meds ordered this encounter  Medications  . bisoprolol (ZEBETA) 5 MG tablet    Sig: Take 5 mg by mouth daily.  . montelukast (SINGULAIR) 10 MG tablet    Sig: Take 10 mg by mouth at bedtime.  Marland Kitchen DISCONTD: predniSONE (DELTASONE) 10 MG tablet    Sig: Take 10 mg by mouth daily with breakfast.  . fluticasone (FLONASE) 50 MCG/ACT nasal Gilmore    Sig: Place 2 sprays into both nostrils at bedtime.    Dispense:  16 g    Refill:  2  . ipratropium (ATROVENT) 0.03 % nasal Gilmore    Sig: Place 2 sprays into the nose 4 (four) times daily.    Dispense:  30 mL    Refill:  1  . omeprazole (PRILOSEC) 40 MG capsule    Sig: Take 1 capsule (40 mg total) by mouth daily.    Dispense:  30 capsule    Refill:  1  . DISCONTD: azithromycin (ZITHROMAX) 250 MG tablet    Sig: Take 2 tabs PO x 1 dose, then 1 tab PO QD x 4 days    Dispense:  6 tablet    Refill:  0  . DISCONTD: azithromycin (ZITHROMAX) 250 MG tablet    Sig: Take 2 tabs PO x 1 dose, then 1  tab PO QD x 4 days    Dispense:  6  tablet    Refill:  0  . methylPREDNISolone acetate (DEPO-MEDROL) injection 120 mg    Sig:   . azithromycin (ZITHROMAX) 250 MG tablet    Sig: Take 2 tabs PO x 1 dose, then 1 tab PO QD x 4 days    Dispense:  6 tablet    Refill:  0  . HYDROcodone-homatropine (HYCODAN) 5-1.5 MG/5ML syrup    Sig: Take 5 mLs by mouth every 8 (eight) hours as needed for cough.    Dispense:  120 mL    Refill:  0    I personally performed the services described in this documentation, which was scribed in my presence. The recorded information has been reviewed and considered, and addended by me as needed.  Norberto Sorenson, MD MPH

## 2013-09-16 ENCOUNTER — Other Ambulatory Visit: Payer: Self-pay | Admitting: Family Medicine

## 2016-08-13 DIAGNOSIS — L918 Other hypertrophic disorders of the skin: Secondary | ICD-10-CM | POA: Diagnosis not present

## 2016-10-28 DIAGNOSIS — N61 Mastitis without abscess: Secondary | ICD-10-CM | POA: Diagnosis not present

## 2016-11-08 DIAGNOSIS — Z0000 Encounter for general adult medical examination without abnormal findings: Secondary | ICD-10-CM | POA: Diagnosis not present

## 2016-11-08 DIAGNOSIS — Z125 Encounter for screening for malignant neoplasm of prostate: Secondary | ICD-10-CM | POA: Diagnosis not present

## 2016-11-15 DIAGNOSIS — Z0000 Encounter for general adult medical examination without abnormal findings: Secondary | ICD-10-CM | POA: Diagnosis not present

## 2016-11-15 DIAGNOSIS — I10 Essential (primary) hypertension: Secondary | ICD-10-CM | POA: Diagnosis not present

## 2016-11-15 DIAGNOSIS — I471 Supraventricular tachycardia: Secondary | ICD-10-CM | POA: Diagnosis not present

## 2017-05-18 DIAGNOSIS — Z23 Encounter for immunization: Secondary | ICD-10-CM | POA: Diagnosis not present

## 2017-10-25 DIAGNOSIS — L820 Inflamed seborrheic keratosis: Secondary | ICD-10-CM | POA: Diagnosis not present

## 2017-10-25 DIAGNOSIS — L738 Other specified follicular disorders: Secondary | ICD-10-CM | POA: Diagnosis not present

## 2017-10-25 DIAGNOSIS — L821 Other seborrheic keratosis: Secondary | ICD-10-CM | POA: Diagnosis not present

## 2017-10-25 DIAGNOSIS — L918 Other hypertrophic disorders of the skin: Secondary | ICD-10-CM | POA: Diagnosis not present

## 2018-04-11 DIAGNOSIS — Z125 Encounter for screening for malignant neoplasm of prostate: Secondary | ICD-10-CM | POA: Diagnosis not present

## 2018-04-11 DIAGNOSIS — R7303 Prediabetes: Secondary | ICD-10-CM | POA: Diagnosis not present

## 2018-04-11 DIAGNOSIS — Z0000 Encounter for general adult medical examination without abnormal findings: Secondary | ICD-10-CM | POA: Diagnosis not present

## 2018-04-14 DIAGNOSIS — I471 Supraventricular tachycardia: Secondary | ICD-10-CM | POA: Diagnosis not present

## 2018-04-14 DIAGNOSIS — I10 Essential (primary) hypertension: Secondary | ICD-10-CM | POA: Diagnosis not present

## 2018-04-14 DIAGNOSIS — Z0000 Encounter for general adult medical examination without abnormal findings: Secondary | ICD-10-CM | POA: Diagnosis not present

## 2018-06-02 DIAGNOSIS — Z23 Encounter for immunization: Secondary | ICD-10-CM | POA: Diagnosis not present

## 2018-07-10 ENCOUNTER — Ambulatory Visit: Payer: Self-pay | Admitting: Family Medicine

## 2018-07-10 VITALS — BP 122/78 | HR 94 | Temp 99.0°F | Wt 283.8 lb

## 2018-07-10 DIAGNOSIS — Z8709 Personal history of other diseases of the respiratory system: Secondary | ICD-10-CM

## 2018-07-10 DIAGNOSIS — R05 Cough: Secondary | ICD-10-CM

## 2018-07-10 DIAGNOSIS — R059 Cough, unspecified: Secondary | ICD-10-CM

## 2018-07-10 DIAGNOSIS — Z889 Allergy status to unspecified drugs, medicaments and biological substances status: Secondary | ICD-10-CM

## 2018-07-10 MED ORDER — BENZONATATE 200 MG PO CAPS: 200.0000 mg | ORAL_CAPSULE | Freq: Three times a day (TID) | ORAL | 0 refills | Status: DC | PRN

## 2018-07-10 MED ORDER — PREDNISONE 20 MG PO TABS: 40.0000 mg | ORAL_TABLET | Freq: Every day | ORAL | 0 refills | Status: AC

## 2018-07-10 NOTE — Progress Notes (Signed)
Terry Gilmore is a 47 y.o. male who presents today with concerns of cough for the last 3 weeks. He reports that cough began with an illness that started with his wife and then he became symptomatic for 3-5 days and recovered but cough persisted. He now reports a frequent cough and increased use of his inhaler. He has chronic asthma and denies recent exacerbation in the last 6 months, he is on Singulair and allegra for maintenance and reports compliance. He has recently traveled and reports use of supportive cough and congestion medications over the counter that provided some relief although not lasting.  Review of Systems  Constitutional: Negative for chills, fever and malaise/fatigue.  HENT: Negative for congestion, ear discharge, ear pain, sinus pain and sore throat.   Eyes: Negative.   Respiratory: Positive for cough. Negative for sputum production and shortness of breath.   Cardiovascular: Negative.  Negative for chest pain.  Gastrointestinal: Negative for abdominal pain, diarrhea, nausea and vomiting.  Genitourinary: Negative for dysuria, frequency, hematuria and urgency.  Musculoskeletal: Negative for myalgias.  Skin: Negative.   Neurological: Negative for headaches.  Endo/Heme/Allergies: Negative.   Psychiatric/Behavioral: Negative.     O: Vitals:   07/10/18 1220  BP: 122/78  Pulse: 94  Temp: 99 F (37.2 C)  SpO2: 96%     Physical Exam  Constitutional: He is oriented to person, place, and time. Vital signs are normal. He appears well-developed and well-nourished. He is active.  Non-toxic appearance. He does not have a sickly appearance.  HENT:  Head: Normocephalic.  Right Ear: Hearing, tympanic membrane, external ear and ear canal normal.  Left Ear: Hearing, tympanic membrane, external ear and ear canal normal.  Nose: Rhinorrhea present. Right sinus exhibits no maxillary sinus tenderness and no frontal sinus tenderness. Left sinus exhibits no maxillary sinus tenderness and  no frontal sinus tenderness.  Mouth/Throat: Uvula is midline. Posterior oropharyngeal erythema present.  Neck: Normal range of motion. Neck supple.  Cardiovascular: Normal rate, regular rhythm, normal heart sounds and normal pulses.  Pulmonary/Chest: Effort normal and breath sounds normal. He has no decreased breath sounds. He has no wheezes. He has no rhonchi. He has no rales.  Frequent dry non productive cough on exam. Not intractable.  Abdominal: Soft. Bowel sounds are normal.  Musculoskeletal: Normal range of motion.  Lymphadenopathy:       Head (right side): No submental and no submandibular adenopathy present.       Head (left side): No submental and no submandibular adenopathy present.    He has no cervical adenopathy.  Neurological: He is alert and oriented to person, place, and time.  Psychiatric: He has a normal mood and affect.  Vitals reviewed.  A: 1. Cough   2. History of asthma   3. History of seasonal allergies    P: Discussed exam findings, diagnosis etiology and medication use and indications reviewed with patient. Follow- Up and discharge instructions provided. No emergent/urgent issues found on exam.  Patient verbalized understanding of information provided and agrees with plan of care (POC), all questions answered.  1. Cough  Primary symptom of cough dry non productive cough on exam- will attempt to decrease inflammation and bronchial irritation, advised follow up if fever develops and symptoms are unimproved.  - predniSONE (DELTASONE) 20 MG tablet; Take 2 tablets (40 mg total) by mouth daily with breakfast for 5 days. - benzonatate (TESSALON) 200 MG capsule; Take 1 capsule (200 mg total) by mouth 3 (three) times daily as needed.  2.  History of asthma  3. History of seasonal allergies Discussed with patient to consider replacing Allegra with alternate antihistamine for allergies due to long term use.

## 2018-07-10 NOTE — Patient Instructions (Signed)
Cough, Adult  Coughing is a reflex that clears your throat and your airways. Coughing helps to heal and protect your lungs. It is normal to cough occasionally, but a cough that happens with other symptoms or lasts a long time may be a sign of a condition that needs treatment. A cough may last only 2-3 weeks (acute), or it may last longer than 8 weeks (chronic).  What are the causes?  Coughing is commonly caused by:   Breathing in substances that irritate your lungs.   A viral or bacterial respiratory infection.   Allergies.   Asthma.   Postnasal drip.   Smoking.   Acid backing up from the stomach into the esophagus (gastroesophageal reflux).   Certain medicines.   Chronic lung problems, including COPD (or rarely, lung cancer).   Other medical conditions such as heart failure.    Follow these instructions at home:  Pay attention to any changes in your symptoms. Take these actions to help with your discomfort:   Take medicines only as told by your health care provider.  ? If you were prescribed an antibiotic medicine, take it as told by your health care provider. Do not stop taking the antibiotic even if you start to feel better.  ? Talk with your health care provider before you take a cough suppressant medicine.   Drink enough fluid to keep your urine clear or pale yellow.   If the air is dry, use a cold steam vaporizer or humidifier in your bedroom or your home to help loosen secretions.   Avoid anything that causes you to cough at work or at home.   If your cough is worse at night, try sleeping in a semi-upright position.   Avoid cigarette smoke. If you smoke, quit smoking. If you need help quitting, ask your health care provider.   Avoid caffeine.   Avoid alcohol.   Rest as needed.    Contact a health care provider if:   You have new symptoms.   You cough up pus.   Your cough does not get better after 2-3 weeks, or your cough gets worse.   You cannot control your cough with suppressant  medicines and you are losing sleep.   You develop pain that is getting worse or pain that is not controlled with pain medicines.   You have a fever.   You have unexplained weight loss.   You have night sweats.  Get help right away if:   You cough up blood.   You have difficulty breathing.   Your heartbeat is very fast.  This information is not intended to replace advice given to you by your health care provider. Make sure you discuss any questions you have with your health care provider.  Document Released: 01/22/2011 Document Revised: 01/01/2016 Document Reviewed: 10/02/2014  Elsevier Interactive Patient Education  2018 Elsevier Inc.

## 2018-07-27 DIAGNOSIS — L738 Other specified follicular disorders: Secondary | ICD-10-CM | POA: Diagnosis not present

## 2018-07-27 DIAGNOSIS — L308 Other specified dermatitis: Secondary | ICD-10-CM | POA: Diagnosis not present

## 2018-07-27 DIAGNOSIS — L570 Actinic keratosis: Secondary | ICD-10-CM | POA: Diagnosis not present

## 2019-10-25 ENCOUNTER — Ambulatory Visit: Payer: Self-pay | Attending: Internal Medicine

## 2019-10-25 DIAGNOSIS — Z23 Encounter for immunization: Secondary | ICD-10-CM

## 2019-10-25 NOTE — Progress Notes (Signed)
   Covid-19 Vaccination Clinic  Name:  Demani Majewski    MRN: [id number] DOB: [birth year]  10/25/2019  Mr. Sinanan was observed post Covid-19 immunization for 15 minutes without incident. He was provided with Vaccine Information Sheet and instruction to access the V-Safe system.   Mr. Lanman was instructed to call 911 with any severe reactions post vaccine: Marland Kitchen Difficulty breathing  . Swelling of face and throat  . A fast heartbeat  . A bad rash all over body  . Dizziness and weakness   Immunizations Administered    Name Date Dose VIS Date Route   Pfizer COVID-19 Vaccine 10/25/2019 12:05 PM 0.3 mL 07/20/2019 Intramuscular   Manufacturer: [place]   Lot: [id number]   [place]: [id number]

## 2019-11-19 ENCOUNTER — Ambulatory Visit: Payer: Self-pay | Attending: Internal Medicine

## 2019-11-19 DIAGNOSIS — Z23 Encounter for immunization: Secondary | ICD-10-CM

## 2019-11-19 NOTE — Progress Notes (Signed)
   Covid-19 Vaccination Clinic  Name:  Terry Gilmore    MRN: [id number] DOB: 11-27-1970  11/19/2019  Terry Gilmore was observed post Covid-19 immunization for 15 minutes without incident. He was provided with Vaccine Information Sheet and instruction to access the V-Safe system.   Terry Gilmore was instructed to call 911 with any severe reactions post vaccine: Marland Kitchen Difficulty breathing  . Swelling of face and throat  . A fast heartbeat  . A bad rash all over body  . Dizziness and weakness   Immunizations Administered    Name Date Dose VIS Date Route   Pfizer COVID-19 Vaccine 11/19/2019 11:02 AM 0.3 mL 07/20/2019 Intramuscular   Manufacturer: [place]   Lot: [id number]   [place]: [id number]

## 2021-01-15 ENCOUNTER — Other Ambulatory Visit: Payer: Self-pay

## 2021-01-19 ENCOUNTER — Ambulatory Visit (INDEPENDENT_AMBULATORY_CARE_PROVIDER_SITE_OTHER): Payer: 59 | Admitting: Podiatry

## 2021-01-19 ENCOUNTER — Encounter: Payer: Self-pay | Admitting: Podiatry

## 2021-01-19 ENCOUNTER — Other Ambulatory Visit: Payer: Self-pay

## 2021-01-19 DIAGNOSIS — L600 Ingrowing nail: Secondary | ICD-10-CM

## 2021-01-19 DIAGNOSIS — B351 Tinea unguium: Secondary | ICD-10-CM

## 2021-01-19 DIAGNOSIS — M79674 Pain in right toe(s): Secondary | ICD-10-CM

## 2021-01-19 NOTE — Patient Instructions (Signed)
If was nice to meet you today. If you have any questions or any further concerns, please feel fee to give me a call. You can call our office at [phone] or please feel fee to send me a message through [employer].   Ingrown Toenail An ingrown toenail occurs when the corner or sides of a toenail grow into the surrounding skin. This causes discomfort and pain. The big toe is most commonly affected, but any of the toes can be affected. If an ingrown toenail is nottreated, it can become infected. What are the causes? This condition may be caused by: Wearing shoes that are too small or tight. An injury, such as stubbing your toe or having your toe stepped on. Improper cutting or care of your toenails. Having nail or foot abnormalities that were present from birth (congenital abnormalities), such as having a nail that is too big for your toe. What increases the risk? The following factors may make you more likely to develop ingrown toenails: Age. Nails tend to get thicker with age, so ingrown nails are more common among older people. Cutting your toenails incorrectly, such as cutting them very short or cutting them unevenly. An ingrown toenail is more likely to get infected if you have: Diabetes. Blood flow (circulation) problems. What are the signs or symptoms? Symptoms of an ingrown toenail may include: Pain, soreness, or tenderness. Redness. Swelling. Hardening of the skin that surrounds the toenail. Signs that an ingrown toenail may be infected include: Fluid or pus. Symptoms that get worse instead of better. How is this diagnosed? An ingrown toenail may be diagnosed based on your medical history, your symptoms, and a physical exam. If you have fluid or blood coming from your toenail, a sample may be collected to test for the specific type of bacteriathat is causing the infection. How is this treated? Treatment depends on how severe your ingrown toenail is. You may be able to care for your  toenail at home. If you have an infection, you may be prescribed antibiotic medicines. If you have fluid or pus draining from your toenail, your health care provider may drain it. If you have trouble walking, you may be given crutches to use. If you have a severe or infected ingrown toenail, you may need a procedure to remove part or all of the nail. Follow these instructions at home: Foot care  Do not pick at your toenail or try to remove it yourself. Soak your foot in warm, soapy water. Do this for 20 minutes, 3 times a day, or as often as told by your health care provider. This helps to keep your toe clean and keep your skin soft. Wear shoes that fit well and are not too tight. Your health care provider may recommend that you wear open-toed shoes while you heal. Trim your toenails regularly and carefully. Cut your toenails straight across to prevent injury to the skin at the corners of the toenail. Do not cut your nails in a curved shape. Keep your feet clean and dry to help prevent infection.  Medicines Take over-the-counter and prescription medicines only as told by your health care provider. If you were prescribed an antibiotic, take it as told by your health care provider. Do not stop taking the antibiotic even if you start to feel better. Activity Return to your normal activities as told by your health care provider. Ask your health care provider what activities are safe for you. Avoid activities that cause pain. General instructions If your  health care provider told you to use crutches to help you move around, use them as instructed. Keep all follow-up visits as told by your health care provider. This is important. Contact a health care provider if: You have more redness, swelling, pain, or other symptoms that do not improve with treatment. You have fluid, blood, or pus coming from your toenail. Get help right away if: You have a red streak on your skin that starts at your foot  and spreads up your leg. You have a fever. Summary An ingrown toenail occurs when the corner or sides of a toenail grow into the surrounding skin. This causes discomfort and pain. The big toe is most commonly affected, but any of the toes can be affected. If an ingrown toenail is not treated, it can become infected. Fluid or pus draining from your toenail is a sign of infection. Your health care provider may need to drain it. You may be given antibiotics to treat the infection. Trimming your toenails regularly and properly can help you prevent an ingrown toenail. This information is not intended to replace advice given to you by your health care provider. Make sure you discuss any questions you have with your healthcare provider. Document Revised: 11/17/2018 Document Reviewed: 04/13/2017 Elsevier Patient Education  2021 [employer].

## 2021-01-21 NOTE — Progress Notes (Signed)
Subjective:   Patient ID: Terry Gilmore, male   DOB: 50 y.o.   MRN: [id number]   HPI 50 year old male presents the office today for concerns of ingrown toenail on his right big toe.  States about 1.5 years ago he dropped a large wood block on the toe the nail came off it as it came back and still get thick and discolored and also ingrown.  Area nail is painful at times.  Denies redness or drainage or any swelling at this time.  ROS Systems otherwise negative      Objective:  Physical Exam  General: AAO x3, NAD  Dermatological: The right hallux nails hypertrophic, dystrophic with brown and yellow discoloration.  Incurvation present to the nail.  There is no edema, erythema or signs of infection today.  No open lesions.  Vascular: Dorsalis Pedis artery and Posterior Tibial artery pedal pulses are 2/4 bilateral with immedate capillary fill time.  There is no pain with calf compression, swelling, warmth, erythema.   Neruologic: Grossly intact via light touch bilateral.   Musculoskeletal: No significant pain today on the ingrown toenail, toe. Muscular strength 5/5 in all groups tested bilateral.  Gait: Unassisted, Nonantalgic.       Assessment:   Ingrown toenail right hallux     Plan:  -Treatment options discussed including all alternatives, risks, and complications -Etiology of symptoms were discussed -I discussed the partial nail avulsion and he wants to proceed with this however he has other plans this week and wants to wait till he gets back to have this performed.  For now we will watch for any signs or symptoms of infection.  Continue Epson salt soaks as needed. -Nails sent to pathology for culture  Trula Slade DPM

## 2021-01-22 ENCOUNTER — Telehealth: Payer: Self-pay | Admitting: *Deleted

## 2021-01-22 NOTE — Telephone Encounter (Signed)
Sent nail culture off today to [facility] and the tracking number is [id number]. Lattie Haw

## 2021-01-28 ENCOUNTER — Other Ambulatory Visit: Payer: Self-pay

## 2021-01-30 ENCOUNTER — Telehealth: Payer: Self-pay | Admitting: *Deleted

## 2021-01-30 NOTE — Telephone Encounter (Signed)
Called and left message for the patient and relayed the message per Dr Jacqualyn Posey. Lattie Haw

## 2021-01-30 NOTE — Telephone Encounter (Signed)
-----   Message from Trula Slade, DPM sent at 01/29/2021  5:42 PM EDT ----- Lattie Haw- please let him know that the culture did not show fungus. He can try urea gel on the nail if he would like. We discussed partial nail avulsion once he can.

## 2021-04-23 ENCOUNTER — Ambulatory Visit (INDEPENDENT_AMBULATORY_CARE_PROVIDER_SITE_OTHER): Payer: 59 | Admitting: Podiatry

## 2021-04-23 ENCOUNTER — Other Ambulatory Visit: Payer: Self-pay

## 2021-04-23 DIAGNOSIS — L600 Ingrowing nail: Secondary | ICD-10-CM

## 2021-04-23 DIAGNOSIS — T148XXA Other injury of unspecified body region, initial encounter: Secondary | ICD-10-CM

## 2021-04-23 NOTE — Patient Instructions (Addendum)

## 2021-04-27 NOTE — Progress Notes (Signed)
Subjective: 50 year old male presents the office today for concerns of ingrown toenail to the right big toe and he states he like to have the nail corner removed today.  They do cause tenderness but denies any swelling redness or drainage.  He also a blood blister to the back of his heel which is been on the prior 1 month.  This started while he did a lot of walking.  No swelling or redness or any drainage.  No other concerns.  Objective: AAO x3, NAD DP/PT pulses palpable bilaterally, CRT less than 3 seconds There is incurvation both the medial lateral aspect the right hallux toenail with mild tenderness palpation.  No edema, erythema, drainage or pus or any signs of infection.  To the posterior aspect of the left heel is a flat dried blood blister.  Upon debridement there is new healthy skin present underneath it.  There is no edema, erythema or signs of infection. No pain with calf compression, swelling, warmth, erythema  Assessment: Right hallux ingrown toenail, blister left heel  Plan: -All treatment options discussed with the patient including all alternatives, risks, complications.  -At this time, the patient is requesting partial nail removal with chemical matricectomy to the symptomatic portion of the nail. Risks and complications were discussed with the patient for which they understand and written consent was obtained. Under sterile conditions a total of 3 mL of a mixture of 2% lidocaine plain and 0.5% Marcaine plain was infiltrated in a hallux block fashion. Once anesthetized, the skin was prepped in sterile fashion. A tourniquet was then applied. Next the medial lateral aspect of hallux nail border was then sharply excised making sure to remove the entire offending nail border. Once the nails were ensured to be removed area was debrided and the underlying skin was intact. There is no purulence identified in the procedure. Next phenol was then applied under standard conditions and copiously  irrigated. Silvadene was applied. A dry sterile dressing was applied. After application of the dressing the tourniquet was removed and there is found to be an immediate capillary refill time to the digit. The patient tolerated the procedure well any complications. Post procedure instructions were discussed the patient for which he verbally understood. Follow-up in one week for nail check or sooner if any problems are to arise. Discussed signs/symptoms of infection and directed to call the office immediately should any occur or go directly to the emergency room. In the meantime, encouraged to call the office with any questions, concerns, changes symptoms. -Debrided some of the tissue on the left heel with any complications or bleeding.  Recommended moisturizer and offloading.  Hopefully the rest of the darkened skin will come out but if it remains discussed other treatment. -Patient encouraged to call the office with any questions, concerns, change in symptoms.   Trula Slade DPM

## 2021-05-07 ENCOUNTER — Ambulatory Visit (INDEPENDENT_AMBULATORY_CARE_PROVIDER_SITE_OTHER): Payer: 59 | Admitting: Podiatry

## 2021-05-07 ENCOUNTER — Other Ambulatory Visit: Payer: Self-pay

## 2021-05-07 DIAGNOSIS — L989 Disorder of the skin and subcutaneous tissue, unspecified: Secondary | ICD-10-CM

## 2021-05-07 DIAGNOSIS — L600 Ingrowing nail: Secondary | ICD-10-CM | POA: Diagnosis not present

## 2021-05-07 NOTE — Patient Instructions (Signed)
I have ordered a medication for you that will come from [facility] in [place]. They should be calling you to verify insurance and will mail the medication to you. If you live close by then you can go by their pharmacy to pick up the medication. Their phone number is [phone]. If you do not hear from them in the next few days, please give us a call at [phone].   

## 2021-05-08 NOTE — Progress Notes (Signed)
Subjective: 50 year old male presents the office today for follow-up evaluation after undergoing right hallux ingrown toenail removal.  He states that he has been doing well.  He had some drainage and pain initially but that has resolved.  Also he had seen a dermatologist with a spot on his left heel.  That seems to be doing better as well and he was told this was nothing to worry about from the dermatologist.  Objective: AAO x3, NAD DP/PT pulses palpable bilaterally, CRT less than 3 seconds Status post partial nail avulsion right hallux.  There is no edema, erythema, drainage or pus no signs of infection.  No open lesion.   Dried blood present in the posterior aspect left heel.  Underlying skin intact.  No edema, erythema or signs of infection. No pain with calf compression, swelling, warmth, erythema  Assessment: Status post ingrown toenail removal, benign skin lesion left heel  Plan: -All treatment options discussed with the patient including all alternatives, risks, complications.  -From the ingrown toenail standpoint he is doing well.  No signs of infection.  Clinically the toenail appears to have fungus but the culture was negative.  Discussed options.  He did not consider the topical compound through [employer].  I will order this for him so can still treat for fungus in case of a false negative as well as having the urea to help treat the damage. -Left heel spot does doing better.  Monitoring signs or symptoms of infection. -Patient encouraged to call the office with any questions, concerns, change in symptoms.   Trula Slade DPM

## 2021-10-07 ENCOUNTER — Encounter: Payer: Self-pay | Admitting: Gastroenterology

## 2021-11-23 ENCOUNTER — Other Ambulatory Visit: Payer: Self-pay

## 2021-11-23 ENCOUNTER — Ambulatory Visit (AMBULATORY_SURGERY_CENTER): Payer: Self-pay | Admitting: *Deleted

## 2021-11-23 VITALS — Ht 68.0 in | Wt 301.0 lb

## 2021-11-23 DIAGNOSIS — Z1211 Encounter for screening for malignant neoplasm of colon: Secondary | ICD-10-CM

## 2021-11-23 MED ORDER — NA SULFATE-K SULFATE-MG SULF 17.5-3.13-1.6 GM/177ML PO SOLN: 1.0000 | Freq: Once | ORAL | 0 refills | Status: AC

## 2021-11-23 NOTE — Progress Notes (Signed)
No egg or soy allergy known to patient  ?No issues known to pt with past sedation with any surgeries or procedures ?Patient denies ever being told they had issues or difficulty with intubation  ?No FH of Malignant Hyperthermia ?Pt is not on diet pills ?Pt is not on  home 02  ?Pt is not on blood thinners  ?Pt denies issues with constipation  ?No A fib or A flutter ? ?SUPREP Coupon to pt in PV today , Code to Pharmacy and  NO PA's for preps discussed with pt In PV today  ?Discussed with pt there will be an out-of-pocket cost for prep and that varies from $0 to 70 +  dollars - pt verbalized understanding  ?Pt instructed to use Singlecare.com or GoodRx for a price reduction on prep  ? ?PV completed over the phone. Pt verified name, DOB, address and insurance during PV today.  ?Pt mailed instruction packet with copy of consent form to read and not return, and instructions.  ?Pt encouraged to call with questions or issues.  ?If pt has My chart, procedure instructions sent via My Chart   ?

## 2021-12-03 ENCOUNTER — Encounter: Payer: Self-pay | Admitting: Gastroenterology

## 2021-12-14 ENCOUNTER — Encounter: Payer: Self-pay | Admitting: Gastroenterology

## 2021-12-14 ENCOUNTER — Other Ambulatory Visit: Payer: Self-pay | Admitting: Gastroenterology

## 2021-12-14 ENCOUNTER — Ambulatory Visit (AMBULATORY_SURGERY_CENTER): Payer: 59 | Admitting: Gastroenterology

## 2021-12-14 VITALS — BP 126/69 | HR 72 | Temp 96.6°F | Resp 16 | Ht 67.0 in | Wt 300.0 lb

## 2021-12-14 DIAGNOSIS — Z1211 Encounter for screening for malignant neoplasm of colon: Secondary | ICD-10-CM

## 2021-12-14 DIAGNOSIS — D122 Benign neoplasm of ascending colon: Secondary | ICD-10-CM

## 2021-12-14 DIAGNOSIS — D123 Benign neoplasm of transverse colon: Secondary | ICD-10-CM | POA: Diagnosis not present

## 2021-12-14 MED ORDER — SODIUM CHLORIDE 0.9 % IV SOLN: 500.0000 mL | Freq: Once | INTRAVENOUS | Status: DC

## 2021-12-14 NOTE — Progress Notes (Signed)
Called to room to assist during endoscopic procedure.  Patient ID and intended procedure confirmed with present staff. Received instructions for my participation in the procedure from the performing physician.  

## 2021-12-14 NOTE — Progress Notes (Signed)
VS by DT  Pt's states no medical or surgical changes since previsit or office visit.  

## 2021-12-14 NOTE — Progress Notes (Signed)
[facility] History and Physical ? ? ?Primary Care Physician:  Merrilee Seashore, MD ? ? ?Reason for Procedure:   Colon cancer screening ? ?Plan:    Screening colonoscopy ? ? ? ? ?HPI: Wister Hoefle is a 51 y.o. male undergoing initial average risk screening colonoscopy.  He has no family history of colon cancer and no chronic GI symptoms.  ? ? ?Past Medical History:  ?Diagnosis Date  ? Allergy   ? SEASONAL  ? Asthma   ? GERD (gastroesophageal reflux disease)   ? Hyperlipidemia   ? NO MEDICATION,LAST VISIT WITH DR.WAS HIGH  ? Hypertension   ? ? ?Past Surgical History:  ?Procedure Laterality Date  ? HERNIA REPAIR    ? Hosp CT-HEAD    ? 09/27/07  ? ? ?Prior to Admission medications   ?Medication Sig Start Date End Date Taking? Authorizing Provider  ?bisoprolol (ZEBETA) 5 MG tablet Take 5 mg by mouth daily.   Yes [provider]  ?Adair Patter 200-25 MCG/ACT AEPB Inhale 1 puff into the lungs daily. 11/07/21  Yes [provider]  ?Fexofenadine HCl (ALLEGRA PO) Take 180 mg by mouth daily.   Yes [provider]  ?lisinopril-hydrochlorothiazide (PRINZIDE,ZESTORETIC) 20-12.5 MG per tablet Take 1 tablet by mouth daily.     Yes [provider]  ?montelukast (SINGULAIR) 10 MG tablet Take 10 mg by mouth at bedtime.   Yes [provider]  ?albuterol (VENTOLIN HFA) 108 (90 Base) MCG/ACT inhaler INHALE 2 PUFFS BY MOUTH 3 TIMES A DAY ?Patient not taking: Reported on 11/23/2021 08/11/21   [provider]  ? ? ?Current Outpatient Medications  ?Medication Sig Dispense Refill  ? bisoprolol (ZEBETA) 5 MG tablet Take 5 mg by mouth daily.    ? BREO ELLIPTA 200-25 MCG/ACT AEPB Inhale 1 puff into the lungs daily.    ? Fexofenadine HCl (ALLEGRA PO) Take 180 mg by mouth daily.    ? lisinopril-hydrochlorothiazide (PRINZIDE,ZESTORETIC) 20-12.5 MG per tablet Take 1 tablet by mouth daily.      ? montelukast (SINGULAIR) 10 MG tablet Take 10 mg by mouth at bedtime.    ? albuterol  (VENTOLIN HFA) 108 (90 Base) MCG/ACT inhaler INHALE 2 PUFFS BY MOUTH 3 TIMES A DAY (Patient not taking: Reported on 11/23/2021)    ? ?Current Facility-Administered Medications  ?Medication Dose Route Frequency Provider Last Rate Last Admin  ? 0.9 %  sodium chloride infusion  500 mL Intravenous Once Daryel November, MD      ? ? ?Allergies as of 12/14/2021 - Review Complete 12/14/2021  ?Allergen Reaction Noted  ? Codeine  10/29/2008  ? Sulfa antibiotics  07/10/2018  ? ? ?Family History  ?Problem Relation Age of Onset  ? Cancer Brother   ? Rectal cancer Maternal Aunt   ? Crohn's disease Cousin   ? Colon cancer Neg Hx   ? Colon polyps Neg Hx   ? Esophageal cancer Neg Hx   ? Stomach cancer Neg Hx   ? ? ?Social History  ? ?Socioeconomic History  ? Marital status: Married  ?  Spouse name: Not on file  ? Number of children: Not on file  ? Years of education: Not on file  ? Highest education level: Not on file  ?Occupational History  ? Not on file  ?Tobacco Use  ? Smoking status: Never  ?  Passive exposure: Past (MOTHER SMOKED)  ? Smokeless tobacco: Never  ?Vaping Use  ? Vaping Use: Never used  ?Substance and Sexual Activity  ?  Alcohol use: Yes  ?  Comment: 2-3 PER MONTH  ? Drug use: Never  ? Sexual activity: Not on file  ?Other Topics Concern  ? Not on file  ?Social History Narrative  ? Not on file  ? ?Social Determinants of Health  ? ?Financial Resource Strain: Not on file  ?Food Insecurity: Not on file  ?Transportation Needs: Not on file  ?Physical Activity: Not on file  ?Stress: Not on file  ?Social Connections: Not on file  ?Intimate Partner Violence: Not on file  ? ? ?Review of Systems: ? ?All other review of systems negative except as mentioned in the HPI. ? ?Physical Exam: ?Vital signs ?BP 119/63   Pulse 95   Temp (!) 96.6 ?F (35.9 ?C)   Ht '5\' 7"'$  (1.702 m)   Wt 300 lb (136.1 kg)   SpO2 94%   BMI 46.99 kg/m?  ? ?General:   Alert,  Well-developed, well-nourished, pleasant and cooperative in NAD ?Airway:   Mallampati 3 ?Lungs:  Clear throughout to auscultation.   ?Heart:  Regular rate and rhythm; no murmurs, clicks, rubs,  or gallops. ?Abdomen:  Soft, nontender and nondistended. Normal bowel sounds.   ?Neuro/Psych:  Normal mood and affect. A and O x 3 ? ? ?Johnasia Liese E. Candis Schatz, MD ?[facility] ? ?

## 2021-12-14 NOTE — Progress Notes (Signed)
PT taken to PACU. Monitors in place. VSS. Report given to RN. 

## 2021-12-14 NOTE — Op Note (Signed)
[place] ?Patient Name: Terry Gilmore ?Procedure Date: 12/14/2021 7:13 AM ?MRN: [id number] ?Endoscopist: Jalaila Caradonna E. Candis Schatz , MD ?Age: 51 ?Referring MD:  ?Date of Birth: [birth year] ?Gender: Male ?Account #: [id number] ?Procedure:                Colonoscopy ?Indications:              Screening for colorectal malignant neoplasm, This  ?                          is the patient's first colonoscopy ?Medicines:                Monitored Anesthesia Care ?Procedure:                Pre-Anesthesia Assessment: ?                          - Prior to the procedure, a History and Physical  ?                          was performed, and patient medications and  ?                          allergies were reviewed. The patient's tolerance of  ?                          previous anesthesia was also reviewed. The risks  ?                          and benefits of the procedure and the sedation  ?                          options and risks were discussed with the patient.  ?                          All questions were answered, and informed consent  ?                          was obtained. Prior Anticoagulants: The patient has  ?                          taken no previous anticoagulant or antiplatelet  ?                          agents. ASA Grade Assessment: III - A patient with  ?                          severe systemic disease. After reviewing the risks  ?                          and benefits, the patient was deemed in  ?                          satisfactory condition to undergo the procedure. ?  After obtaining informed consent, the colonoscope  ?                          was passed under direct vision. Throughout the  ?                          procedure, the patient's blood pressure, pulse, and  ?                          oxygen saturations were monitored continuously. The  ?                          Olympus CF-HQ190L (Serial# [id number]) Colonoscope was  ?                          introduced through  the anus and advanced to the the  ?                          terminal ileum, with identification of the  ?                          appendiceal orifice and IC valve. The colonoscopy  ?                          was performed without difficulty. The patient  ?                          tolerated the procedure well. The quality of the  ?                          bowel preparation was adequate. The ileocecal  ?                          valve, appendiceal orifice, and rectum were  ?                          photographed. The bowel preparation used was SUPREP  ?                          via split dose instruction. ?Scope In: 7:59:26 AM ?Scope Out: 8:16:27 AM ?Scope Withdrawal Time: 0 hours 13 minutes 7 seconds  ?Total Procedure Duration: 0 hours 17 minutes 1 second  ?Findings:                 The perianal and digital rectal examinations were  ?                          normal. Pertinent negatives include normal  ?                          sphincter tone and no palpable rectal lesions. ?                          Two sessile polyps were found in the transverse  ?  colon. The polyps were 2 to 3 mm in size. These  ?                          polyps were removed with a cold snare. Resection  ?                          and retrieval were complete. Estimated blood loss  ?                          was minimal. ?                          A 8 mm polyp was found in the ascending colon. The  ?                          polyp was sessile. The polyp was removed with a  ?                          cold snare. Resection and retrieval were complete.  ?                          Estimated blood loss was minimal. ?                          A few medium-mouthed diverticula were found in the  ?                          sigmoid colon and ascending colon. ?                          The exam was otherwise normal throughout the  ?                          examined colon. ?                          The terminal ileum appeared  normal. ?                          The retroflexed view of the distal rectum and anal  ?                          verge was normal and showed no anal or rectal  ?                          abnormalities. ?Complications:            No immediate complications. ?Estimated Blood Loss:     Estimated blood loss was minimal. ?Impression:               - Two 2 to 3 mm polyps in the transverse colon,  ?                          removed with a cold snare. Resected and retrieved. ?                          -  One 8 mm polyp in the ascending colon, removed  ?                          with a cold snare. Resected and retrieved. ?                          - Diverticulosis in the sigmoid colon and in the  ?                          ascending colon. ?                          - The examined portion of the ileum was normal. ?                          - The distal rectum and anal verge are normal on  ?                          retroflexion view. ?Recommendation:           - Patient has a contact number available for  ?                          emergencies. The signs and symptoms of potential  ?                          delayed complications were discussed with the  ?                          patient. Return to normal activities tomorrow.  ?                          Written discharge instructions were provided to the  ?                          patient. ?                          - Resume previous diet. ?                          - Continue present medications. ?                          - Await pathology results. ?                          - Repeat colonoscopy (date not yet determined) for  ?                          surveillance based on pathology results. ?Raney Antwine E. Candis Schatz, MD ?12/14/2021 8:23:21 AM ?This report has been signed electronically. ?

## 2021-12-14 NOTE — Patient Instructions (Signed)
? ?  Handouts on polyps and diverticulitis given. Please get tested for sleep apnea.  ? ?YOU HAD AN ENDOSCOPIC PROCEDURE TODAY AT [facility]:   Refer to the procedure report that was given to you for any specific questions about what was found during the examination.  If the procedure report does not answer your questions, please call your gastroenterologist to clarify.  If you requested that your care partner not be given the details of your procedure findings, then the procedure report has been included in a sealed envelope for you to review at your convenience later. ? ?YOU SHOULD EXPECT: Some feelings of bloating in the abdomen. Passage of more gas than usual.  Walking can help get rid of the air that was put into your GI tract during the procedure and reduce the bloating. If you had a lower endoscopy (such as a colonoscopy or flexible sigmoidoscopy) you may notice spotting of blood in your stool or on the toilet paper. If you underwent a bowel prep for your procedure, you may not have a normal bowel movement for a few days. ? ?Please Note:  You might notice some irritation and congestion in your nose or some drainage.  This is from the oxygen used during your procedure.  There is no need for concern and it should clear up in a day or so. ? ?SYMPTOMS TO REPORT IMMEDIATELY: ? ?Following lower endoscopy (colonoscopy or flexible sigmoidoscopy): ? Excessive amounts of blood in the stool ? Significant tenderness or worsening of abdominal pains ? Swelling of the abdomen that is new, acute ? Fever of 100?F or higher ? ? ?For urgent or emergent issues, a gastroenterologist can be reached at any hour by calling [phone]. ?Do not use MyChart messaging for urgent concerns.  ? ? ?DIET:  We do recommend a small meal at first, but then you may proceed to your regular diet.  Drink plenty of fluids but you should avoid alcoholic beverages for 24 hours. ? ?ACTIVITY:  You should plan to take it easy for the  rest of today and you should NOT DRIVE or use heavy machinery until tomorrow (because of the sedation medicines used during the test).   ? ?FOLLOW UP: ?Our staff will call the number listed on your records 48-72 hours following your procedure to check on you and address any questions or concerns that you may have regarding the information given to you following your procedure. If we do not reach you, we will leave a message.  We will attempt to reach you two times.  During this call, we will ask if you have developed any symptoms of COVID 19. If you develop any symptoms (ie: fever, flu-like symptoms, shortness of breath, cough etc.) before then, please call [phone].  If you test positive for Covid 19 in the 2 weeks post procedure, please call and report this information to [place].   ? ?If any biopsies were taken you will be contacted by phone or by letter within the next 1-3 weeks.  Please call us at [phone] if you have not heard about the biopsies in 3 weeks.  ? ? ?SIGNATURES/CONFIDENTIALITY: ?You and/or your care partner have signed paperwork which will be entered into your electronic medical record.  These signatures attest to the fact that that the information above on your After Visit Summary has been reviewed and is understood.  Full responsibility of the confidentiality of this discharge information lies with you and/or your care-partner.  ?

## 2022-09-14 ENCOUNTER — Other Ambulatory Visit: Payer: Self-pay

## 2022-09-14 ENCOUNTER — Other Ambulatory Visit (HOSPITAL_COMMUNITY): Payer: Self-pay

## 2022-09-14 MED ORDER — ZEPBOUND 7.5 MG/0.5ML ~~LOC~~ SOAJ
SUBCUTANEOUS | 0 refills | Status: AC
Start: 2022-09-13 — End: ?
  Filled 2022-10-22: qty 2, 28d supply, fill #0

## 2022-09-14 MED ORDER — ZEPBOUND 10 MG/0.5ML ~~LOC~~ SOAJ
SUBCUTANEOUS | 0 refills | Status: AC
Start: 2022-09-13 — End: ?
  Filled 2022-11-30: qty 2, 28d supply, fill #0

## 2022-09-14 MED ORDER — ZEPBOUND 5 MG/0.5ML ~~LOC~~ SOAJ
SUBCUTANEOUS | 0 refills | Status: AC
Start: 2022-09-13 — End: ?
  Filled 2022-09-14 – 2022-09-24 (×2): qty 2, 28d supply, fill #0

## 2022-09-15 ENCOUNTER — Other Ambulatory Visit (HOSPITAL_COMMUNITY): Payer: Self-pay

## 2022-09-21 ENCOUNTER — Other Ambulatory Visit (HOSPITAL_COMMUNITY): Payer: Self-pay

## 2022-09-21 ENCOUNTER — Other Ambulatory Visit (HOSPITAL_BASED_OUTPATIENT_CLINIC_OR_DEPARTMENT_OTHER): Payer: Self-pay

## 2022-09-22 ENCOUNTER — Other Ambulatory Visit (HOSPITAL_COMMUNITY): Payer: Self-pay

## 2022-09-24 ENCOUNTER — Other Ambulatory Visit (HOSPITAL_COMMUNITY): Payer: Self-pay

## 2022-10-22 ENCOUNTER — Other Ambulatory Visit (HOSPITAL_COMMUNITY): Payer: Self-pay

## 2022-11-30 ENCOUNTER — Other Ambulatory Visit (HOSPITAL_COMMUNITY): Payer: Self-pay

## 2022-11-30 ENCOUNTER — Other Ambulatory Visit (HOSPITAL_BASED_OUTPATIENT_CLINIC_OR_DEPARTMENT_OTHER): Payer: Self-pay

## 2022-12-01 ENCOUNTER — Other Ambulatory Visit (HOSPITAL_COMMUNITY): Payer: Self-pay

## 2022-12-22 ENCOUNTER — Other Ambulatory Visit (HOSPITAL_COMMUNITY): Payer: Self-pay

## 2022-12-22 ENCOUNTER — Other Ambulatory Visit: Payer: Self-pay

## 2022-12-22 MED ORDER — TIRZEPATIDE-WEIGHT MANAGEMENT 12.5 MG/0.5ML ~~LOC~~ SOAJ
12.5000 mg | SUBCUTANEOUS | 2 refills | Status: AC
  Filled 2022-12-22: qty 2, 28d supply, fill #0

## 2023-01-14 ENCOUNTER — Other Ambulatory Visit (HOSPITAL_COMMUNITY): Payer: Self-pay

## 2023-01-14 MED ORDER — ZEPBOUND 15 MG/0.5ML ~~LOC~~ SOAJ
15.0000 mg | SUBCUTANEOUS | 5 refills | Status: DC
  Filled 2023-01-14: qty 2, 28d supply, fill #0
  Filled 2023-02-15: qty 2, 28d supply, fill #1
  Filled 2023-03-21: qty 2, 28d supply, fill #2
  Filled 2023-04-14 – 2023-04-21 (×3): qty 2, 28d supply, fill #3
  Filled 2023-05-20: qty 2, 28d supply, fill #4
  Filled 2023-06-17: qty 2, 28d supply, fill #5

## 2023-02-15 ENCOUNTER — Other Ambulatory Visit (HOSPITAL_COMMUNITY): Payer: Self-pay

## 2023-02-16 ENCOUNTER — Other Ambulatory Visit (HOSPITAL_COMMUNITY): Payer: Self-pay

## 2023-03-21 ENCOUNTER — Other Ambulatory Visit (HOSPITAL_COMMUNITY): Payer: Self-pay

## 2023-04-15 ENCOUNTER — Other Ambulatory Visit (HOSPITAL_COMMUNITY): Payer: Self-pay

## 2023-04-21 ENCOUNTER — Other Ambulatory Visit (HOSPITAL_COMMUNITY): Payer: Self-pay

## 2023-05-20 ENCOUNTER — Other Ambulatory Visit (HOSPITAL_COMMUNITY): Payer: Self-pay

## 2023-06-17 ENCOUNTER — Other Ambulatory Visit (HOSPITAL_COMMUNITY): Payer: Self-pay

## 2023-07-15 ENCOUNTER — Other Ambulatory Visit (HOSPITAL_COMMUNITY): Payer: Self-pay

## 2023-07-18 ENCOUNTER — Other Ambulatory Visit (HOSPITAL_COMMUNITY): Payer: Self-pay

## 2023-07-18 MED ORDER — ZEPBOUND 15 MG/0.5ML ~~LOC~~ SOAJ
15.0000 mg | SUBCUTANEOUS | 5 refills | Status: DC
Start: 2023-07-17 — End: 2024-02-02
  Filled 2023-07-18: qty 2, 28d supply, fill #0
  Filled 2023-08-08: qty 2, 28d supply, fill #1
  Filled 2023-09-10: qty 2, 28d supply, fill #2
  Filled 2023-10-11: qty 2, 28d supply, fill #3
  Filled 2023-11-11 – 2023-11-12 (×2): qty 2, 28d supply, fill #4
  Filled 2023-12-23: qty 2, 28d supply, fill #5

## 2023-08-08 ENCOUNTER — Other Ambulatory Visit (HOSPITAL_COMMUNITY): Payer: Self-pay

## 2023-09-10 ENCOUNTER — Other Ambulatory Visit (HOSPITAL_COMMUNITY): Payer: Self-pay

## 2023-10-11 ENCOUNTER — Other Ambulatory Visit (HOSPITAL_COMMUNITY): Payer: Self-pay

## 2023-11-11 ENCOUNTER — Other Ambulatory Visit (HOSPITAL_COMMUNITY): Payer: Self-pay

## 2023-11-12 ENCOUNTER — Other Ambulatory Visit (HOSPITAL_COMMUNITY): Payer: Self-pay

## 2023-11-12 ENCOUNTER — Other Ambulatory Visit: Payer: Self-pay

## 2023-12-23 ENCOUNTER — Other Ambulatory Visit (HOSPITAL_COMMUNITY): Payer: Self-pay

## 2024-02-02 ENCOUNTER — Other Ambulatory Visit (HOSPITAL_COMMUNITY): Payer: Self-pay

## 2024-02-02 MED ORDER — ZEPBOUND 15 MG/0.5ML ~~LOC~~ SOAJ
15.0000 mg | SUBCUTANEOUS | 0 refills | Status: AC
  Filled 2024-02-02: qty 2, 28d supply, fill #0

## 2024-02-14 ENCOUNTER — Other Ambulatory Visit (HOSPITAL_COMMUNITY): Payer: Self-pay

## 2024-04-16 ENCOUNTER — Other Ambulatory Visit: Payer: Self-pay

## 2024-04-16 ENCOUNTER — Emergency Department (HOSPITAL_BASED_OUTPATIENT_CLINIC_OR_DEPARTMENT_OTHER): Payer: Self-pay

## 2024-04-16 ENCOUNTER — Emergency Department (HOSPITAL_BASED_OUTPATIENT_CLINIC_OR_DEPARTMENT_OTHER)
Admission: EM | Admit: 2024-04-16 | Discharge: 2024-04-16 | Disposition: A | Discharge status: Home / Self Care | Payer: Self-pay | Attending: Emergency Medicine | Admitting: Emergency Medicine

## 2024-04-16 DIAGNOSIS — M7661 Achilles tendinitis, right leg: Secondary | ICD-10-CM | POA: Diagnosis present

## 2024-04-16 DIAGNOSIS — S86011A Strain of right Achilles tendon, initial encounter: Secondary | ICD-10-CM | POA: Insufficient documentation

## 2024-04-16 DIAGNOSIS — W010XXA Fall on same level from slipping, tripping and stumbling without subsequent striking against object, initial encounter: Secondary | ICD-10-CM | POA: Diagnosis not present

## 2024-04-16 MED ORDER — OXYCODONE HCL 5 MG PO TABS
5.0000 mg | ORAL_TABLET | Freq: Once | ORAL | Status: AC
  Administered 2024-04-16: 5 mg via ORAL
  Filled 2024-04-16: qty 1

## 2024-04-16 MED ORDER — IBUPROFEN 400 MG PO TABS
600.0000 mg | ORAL_TABLET | Freq: Once | ORAL | Status: AC
  Administered 2024-04-16: 600 mg via ORAL
  Filled 2024-04-16: qty 1

## 2024-04-16 MED ORDER — OXYCODONE HCL 5 MG PO TABS: 5.0000 mg | ORAL_TABLET | Freq: Four times a day (QID) | ORAL | 0 refills | Status: AC | PRN

## 2024-04-16 NOTE — ED Notes (Addendum)
 Discharge instructions reviewed.   Newly prescribed medications discussed. Pharmacy verified.   Opportunity for questions and concerns provided.   Alert, oriented and escorted to spouses vehicle via wheelchair.   Displays no signs of distress.   Encouraged to follow up with orthopedic soon as possible.

## 2024-04-16 NOTE — ED Triage Notes (Signed)
 Pt POV after mechanical fall, dog got off of leash, pt lunged forward and fell, felt R achilles pop, reporting increased pain. Swelling noted, pulses intact.

## 2024-04-16 NOTE — ED Provider Notes (Signed)
 [place] EMERGENC[facility] DEPARTMENT AT [facility] Provider Note   CSN: [id number] Arrival date & time: 04/16/24  1758     Patient presents with: Terry Gilmore is a 53 y.o. male with no significant past medical history presents with concern for right Achilles pain that started earlier today.  States he is dog got loose, and he lunged after the dog, and felt an immediate pop in the back of his right ankle.  He reported some numbness sensation in his right foot for about 20 minutes after this occurred, but then he regained sensation in his right foot.  This injury caused him to fall to the ground, but he denies hitting his head or any loss of consciousness.  Denies pain elsewhere.    Fall       Prior to Admission medications   Medication Sig Start Date End Date Taking? Authorizing Provider  oxyCODONE  (ROXICODONE ) 5 MG immediate release tablet Take 1 tablet (5 mg total) by mouth every 6 (six) hours as needed for severe pain (pain score 7-10) or breakthrough pain (Pain not controlled with Tylenol and ibuprofen ). 04/16/24  Yes Lamija Besse, PA-C  albuterol (VENTOLIN HFA) 108 (90 Base) MCG/ACT inhaler INHALE 2 PUFFS BY MOUTH 3 TIMES A DAY Patient not taking: Reported on 11/23/2021 08/11/21   [provider]  bisoprolol (ZEBETA) 5 MG tablet Take 5 mg by mouth daily.    [provider]  BREO ELLIPTA 200-25 MCG/ACT AEPB Inhale 1 puff into the lungs daily. 11/07/21   [provider]  Fexofenadine HCl (ALLEGRA PO) Take 180 mg by mouth daily.    [provider]  lisinopril-hydrochlorothiazide (PRINZIDE,ZESTORETIC) 20-12.5 MG per tablet Take 1 tablet by mouth daily.      [provider]  montelukast (SINGULAIR) 10 MG tablet Take 10 mg by mouth at bedtime.    [provider]  tirzepatide  (ZEPBOUND ) 10 MG/0.5ML Pen Inject 10 mg subcutaneously weekly 09/13/22     tirzepatide  (ZEPBOUND ) 12.5 MG/0.5ML Pen Inject 12.5 mg into the skin once a  week. 12/14/22     tirzepatide  (ZEPBOUND ) 15 MG/0.5ML Pen Inject 15 mg into the skin once a week. 02/02/24     tirzepatide  (ZEPBOUND ) 5 MG/0.5ML Pen Inject 5mg  subcutaneously once a week 09/13/22     tirzepatide  (ZEPBOUND ) 7.5 MG/0.5ML Pen Inject 7.5 mg subcutaneously weekly 09/13/22       Allergies: Codeine and Sulfa antibiotics    Review of Systems  Musculoskeletal:        Right Achilles pain    Updated Vital Signs BP 127/62   Pulse 67   Temp 98.2 F (36.8 C) (Oral)   Resp 18   Ht 5' 8 (1.727 m)   Wt 120.2 kg   SpO2 100%   BMI 40.29 kg/m   Physical Exam Vitals and nursing note reviewed.  Constitutional:      Appearance: Normal appearance.  HENT:     Head: Atraumatic.  Cardiovascular:     Comments: 2+ pedal pulses bilaterally   Pulmonary:     Effort: Pulmonary effort is normal.  Musculoskeletal:     Comments: Right lower extremity:  General No erythema, edema, contusions, open wounds   Palpation Tender to palpation over the Achilles tendon. Achilles tendon feels to be partially balled up in the posterior aspect of the ankle.  Nontender along the tibia, fibula, patella Non-tender along the ATFL, CFL, PTFL Nontender along the first through fifth metatarsals and phalanges  ROM Intact ankle flexion, extension,  inversion and eversion, but ankle plantarflexion slightly limited  Special tests Negative Thompson's (squeeze) test, ankle plantarflexes  Sensation: Sensation intact throughout the lower extremity   Neurological:     General: No focal deficit present.     Mental Status: He is alert.     Comments: Intact sensation to the right foot diffusely  Psychiatric:        Mood and Affect: Mood normal.        Behavior: Behavior normal.     (all labs ordered are listed, but only abnormal results are displayed) Labs Reviewed - No data to display  EKG: None  Radiology: DG Foot Complete Right Result Date: 04/16/2024 CLINICAL DATA:  Clemens, felt right Achilles  tendon pop, pain EXAM: RIGHT FOOT COMPLETE - 3+ VIEW COMPARISON:  None Available. FINDINGS: Frontal, oblique, and lateral views of the right foot are obtained. There are no acute displaced fractures. Alignment is grossly anatomic. Joint spaces are well preserved. There are os ossific density seen superior to the calcaneus, which could reflect avulsion fractures in the setting of Achilles tendon rupture. There is soft tissue edema throughout the dorsal margin of the distal right lower leg and ankle. Small inferior calcaneal spur incidentally noted. IMPRESSION: 1. Findings suggestive of Achilles tendon rupture with soft tissue edema and likely avulsion fracture of the enthesopathic changes off the superior aspect of the calcaneus. Electronically Signed   By: Ozell Daring M.D.   On: 04/16/2024 18:27     Procedures   Medications Ordered in the ED  oxyCODONE  (Oxy IR/ROXICODONE ) immediate release tablet 5 mg (5 mg Oral Given 04/16/24 2054)  ibuprofen  (ADVIL ) tablet 600 mg (600 mg Oral Given 04/16/24 2054)                                    Medical Decision Making Amount and/or Complexity of Data Reviewed Radiology: ordered.  Risk Prescription drug management.     Differential diagnosis includes but is not limited to fracture, dislocation, sprain, tendon rupture, contusion, compartment syndrome  ED Course:  Upon initial evaluation, patient is well-appearing, no acute distress.  Stable vitals.  He pushed off of his right foot earlier chasing his dog, and had an immediate pop in the back of his right ankle.  On exam, he is tender along the right Achilles tendon.  There does feel to be a bump over the posterior aspect of the ankle which is likely a ruptured Achilles tendon.  He has intact plantarflexion, but it is limited.  Suspect partial achilles tendon rupture.  He is nontender elsewhere of the patella, tibia, fibula, 1st through 5th metatarsals or phalanges.  He is neurovascularly intact in the  right lower extremity.  Calf is soft.  I do not have any concern for compartment syndrome at this time.  He did not hit his head or lose consciousness.  No pain elsewhere in the upper or lower extremities.  X-ray of the right ankle was obtained which showed findings that are suggestive of Achilles tendon rupture given soft tissue edema in the ankle and likely avulsion fracture of the enthesopathic changes off of the superior aspect of the calcaneus.  Imaging Studies ordered: I ordered imaging studies including x-ray right ankle I independently visualized the imaging with scope of interpretation limited to determining acute life threatening conditions related to emergency care. Imaging showed  IMPRESSION: 1. Findings suggestive of Achilles tendon rupture with soft tissue  edema and likely avulsion fracture of the enthesopathic changes off the superior aspect of the calcaneus. I agree with the radiologist interpretation   Medications Given: Oxycodone  Ibuprofen   Patient was placed into a short leg cast for his right Achilles tendon partial rupture.  He remains neurovascularly intact after splint placement.  He was provided crutches and was able to use these without difficulty.  He understands he must remain nonweightbearing on the right foot.  He is stable and appropriate discharge home.    Impression: Right Achilles partial tendon rupture  Disposition:  The patient was discharged home with instructions to keep splint on and use crutches to remain nonweightbearing on the right foot.  May use Tylenol and ibuprofen  as needed for pain.  Oxycodone  for breakthrough pain.  He understands oxycodone  may make him drowsy and do not drink alcohol or drive after taking this medication.  He understands he may not drive while he has the splint on, and he must have someone else drive him.  Follow-up with orthopedics, Dr. Celena, within the next week.  He understands he needs to call to schedule this  appointment. Return precautions given.     This chart was dictated using voice recognition software, Dragon. Despite the best efforts of this provider to proofread and correct errors, errors may still occur which can change documentation meaning.       Final diagnoses:  Partial tear of right Achilles tendon, initial encounter    ED Discharge Orders          Ordered    oxyCODONE  (ROXICODONE ) 5 MG immediate release tablet  Every 6 hours PRN        04/16/24 2146               Veta Palma, PA-C 04/16/24 2155    Dreama Longs, MD 04/17/24 1253

## 2024-04-16 NOTE — Discharge Instructions (Addendum)
 Based on your exam today, you likely have a partial tear of your right Achilles tendon.  The x-ray noted that you may have a small avulsion fracture in the area of the tendon rupture.  No other fracture or dislocation was noted.  You have been placed into a splint to allow for healing of the achilles tendon.  Please keep this splint clean and dry.  Wrap the splint with a garbage bag or Saran wrap to keep it dry while showering or bathing.  Do not take off the splint.   You may not place weight onto your right foot.  Use the crutches provided to help you ambulate.  You may not drive until cleared by orthopedics.  You may take up to 1000mg  of tylenol every 6 hours as needed for pain. Do not take more then 4g per day.   You may use up to 600mg  ibuprofen  every 6 hours as needed for pain.  Do not exceed 2.4g of ibuprofen  per day.  You were given your first dose of the ibuprofen  here today.  You may alternate these medications for complete coverage. See instructions below: Take 600mg  ibuprofen , then 3 hours later take 1000mg  tylenol, then 3 hours later 600mg  ibuprofen , then 3 hours later 1000mg  tylenol, so on and so forth for pain control.    You have been prescribed Oxycodone -this is a narcotic/controlled substance medication that has potential addicting qualities.  You may take 1 tablet every 6 hours as needed for severe pain not controlled with tylenol and ibuprofen .  Do not drive or operate heavy machinery when taking this medicine as it can be sedating. Do not drink alcohol or take other sedating medications when taking this medicine for safety reasons.  Keep this out of reach of small children.  You were given your first dose of oxycodone  here today.   Call the orthopedics office listed below (Dr. Celena) to schedule a follow-up appointment within the next week  Return to the ER for any uncontrolled pain, numbness or tingling, discoloration, any other new or concerning symptoms.
